# Patient Record
Sex: Female | Born: 1975 | Race: Black or African American | Hispanic: No | Marital: Married | Smoking: Never smoker
Health system: Southern US, Community
[De-identification: ages and names within clinical notes are randomized; demographics above are authoritative.]

## PROBLEM LIST (undated history)

## (undated) DIAGNOSIS — Z86718 Personal history of other venous thrombosis and embolism: Secondary | ICD-10-CM

## (undated) DIAGNOSIS — E0500 Thyrotoxicosis with diffuse goiter without thyrotoxic crisis or storm: Secondary | ICD-10-CM

## (undated) HISTORY — DX: Thyrotoxicosis with diffuse goiter without thyrotoxic crisis or storm: E05.00

---

## 2004-06-01 ENCOUNTER — Encounter (HOSPITAL_COMMUNITY): Admission: RE | Admit: 2004-06-01 | Discharge: 2004-08-30 | Payer: Self-pay | Admitting: Endocrinology

## 2005-01-03 DIAGNOSIS — Z86718 Personal history of other venous thrombosis and embolism: Secondary | ICD-10-CM

## 2005-01-03 HISTORY — DX: Personal history of other venous thrombosis and embolism: Z86.718

## 2005-04-15 ENCOUNTER — Encounter: Payer: Self-pay | Admitting: Vascular Surgery

## 2005-04-15 ENCOUNTER — Ambulatory Visit (HOSPITAL_COMMUNITY): Admission: RE | Admit: 2005-04-15 | Discharge: 2005-04-15 | Payer: Self-pay | Admitting: Internal Medicine

## 2006-11-27 ENCOUNTER — Ambulatory Visit: Admission: RE | Admit: 2006-11-27 | Discharge: 2006-11-27 | Payer: Self-pay | Admitting: Obstetrics and Gynecology

## 2006-11-27 ENCOUNTER — Inpatient Hospital Stay (HOSPITAL_COMMUNITY): Admission: AD | Admit: 2006-11-27 | Discharge: 2006-11-27 | Payer: Self-pay | Admitting: Obstetrics and Gynecology

## 2006-11-27 ENCOUNTER — Ambulatory Visit: Payer: Self-pay | Admitting: *Deleted

## 2006-11-27 ENCOUNTER — Encounter (HOSPITAL_COMMUNITY): Payer: Self-pay | Admitting: Obstetrics and Gynecology

## 2007-04-11 ENCOUNTER — Inpatient Hospital Stay (HOSPITAL_COMMUNITY): Admission: AD | Admit: 2007-04-11 | Discharge: 2007-04-11 | Payer: Self-pay | Admitting: Obstetrics and Gynecology

## 2007-04-12 ENCOUNTER — Inpatient Hospital Stay (HOSPITAL_COMMUNITY): Admission: RE | Admit: 2007-04-12 | Discharge: 2007-04-15 | Payer: Self-pay | Admitting: Obstetrics and Gynecology

## 2007-06-30 ENCOUNTER — Emergency Department (HOSPITAL_COMMUNITY): Admission: EM | Admit: 2007-06-30 | Discharge: 2007-07-01 | Payer: Self-pay | Admitting: Emergency Medicine

## 2008-01-09 ENCOUNTER — Ambulatory Visit (HOSPITAL_COMMUNITY): Admission: RE | Admit: 2008-01-09 | Discharge: 2008-01-09 | Payer: Self-pay | Admitting: Obstetrics and Gynecology

## 2008-01-18 ENCOUNTER — Ambulatory Visit (HOSPITAL_COMMUNITY): Admission: RE | Admit: 2008-01-18 | Discharge: 2008-01-18 | Payer: Self-pay | Admitting: Obstetrics and Gynecology

## 2008-02-15 ENCOUNTER — Ambulatory Visit (HOSPITAL_COMMUNITY): Admission: RE | Admit: 2008-02-15 | Discharge: 2008-02-15 | Payer: Self-pay | Admitting: Obstetrics and Gynecology

## 2008-03-01 ENCOUNTER — Inpatient Hospital Stay (HOSPITAL_COMMUNITY): Admission: AD | Admit: 2008-03-01 | Discharge: 2008-03-01 | Payer: Self-pay | Admitting: Obstetrics and Gynecology

## 2008-03-14 ENCOUNTER — Ambulatory Visit (HOSPITAL_COMMUNITY): Admission: RE | Admit: 2008-03-14 | Discharge: 2008-03-14 | Payer: Self-pay | Admitting: Internal Medicine

## 2008-06-21 ENCOUNTER — Inpatient Hospital Stay (HOSPITAL_COMMUNITY): Admission: AD | Admit: 2008-06-21 | Discharge: 2008-06-24 | Payer: Self-pay | Admitting: Obstetrics and Gynecology

## 2009-01-03 HISTORY — PX: OTHER SURGICAL HISTORY: SHX169

## 2009-04-23 IMAGING — US US OB FOLLOW-UP
1 series · 18 of 28 positions shown · non-contrast
Comparison: none

OBSTETRICAL ULTRASOUND:
 This ultrasound was performed in The [HOSPITAL], and the [facility] report will be stored to [REDACTED] PACS.

[Series 1: us ob follow-up · 18 of 55 slices shown]
[im 1/55]
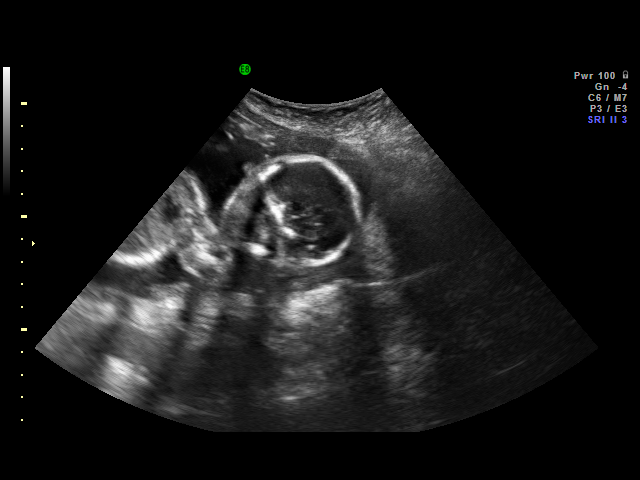
[im 5/55]
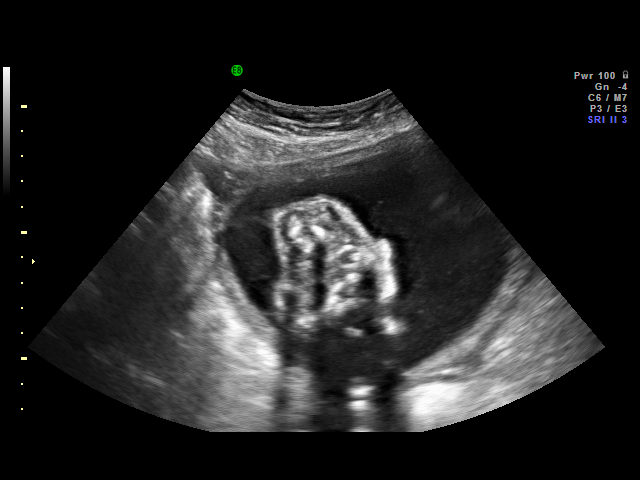
[im 7/55]
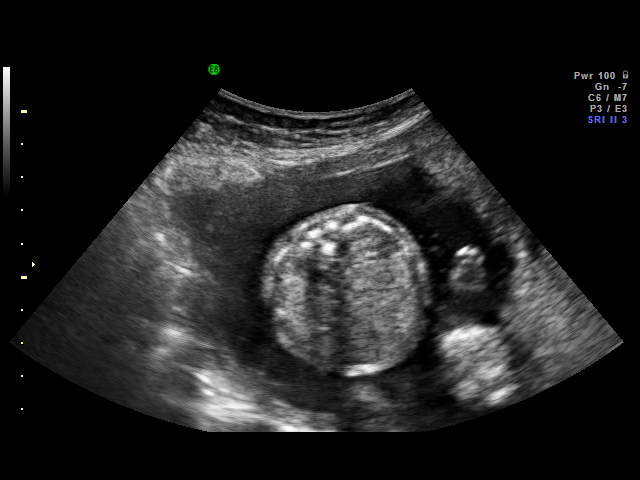
[im 11/55]
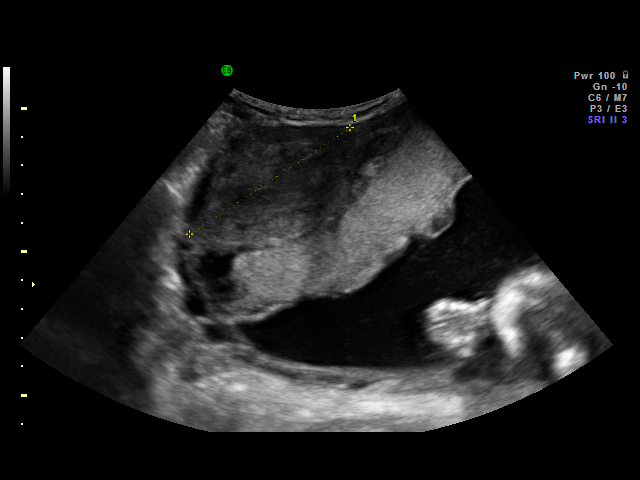
[im 15/55]
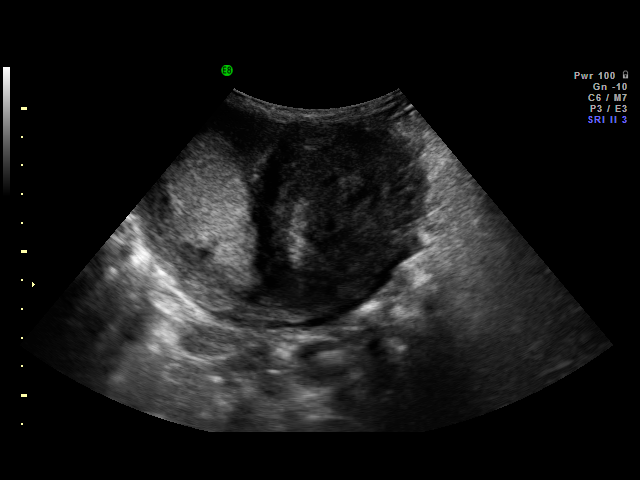
[im 17/55]
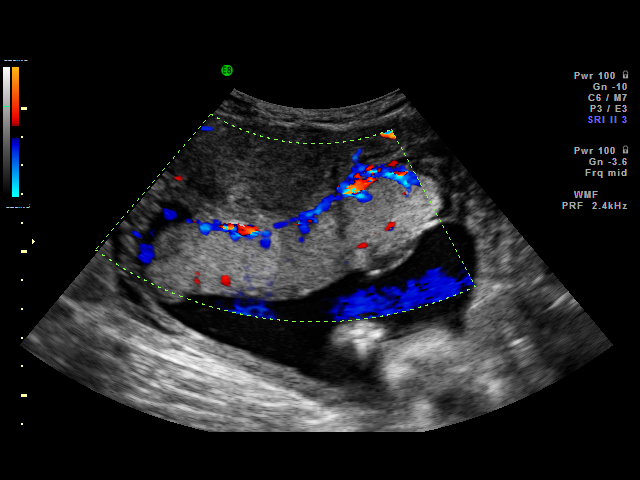
[im 21/55]
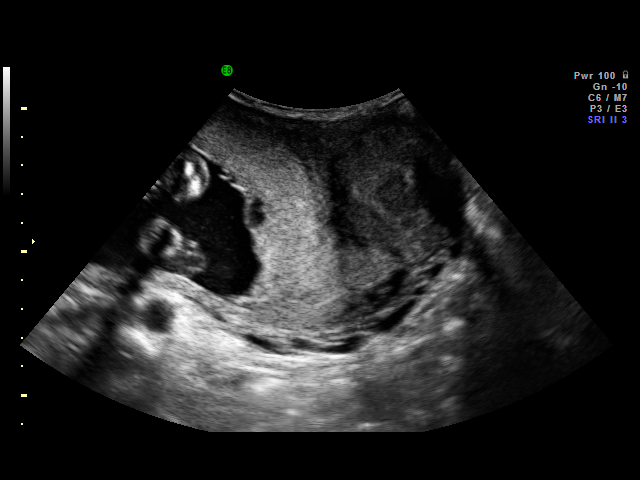
[im 23/55]
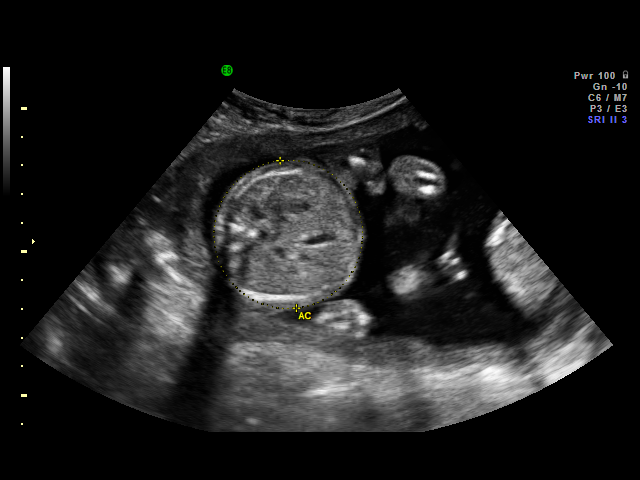
[im 27/55]
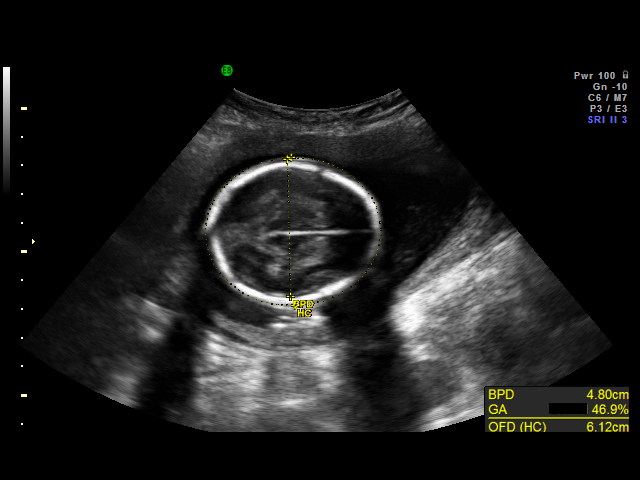
[im 29/55]
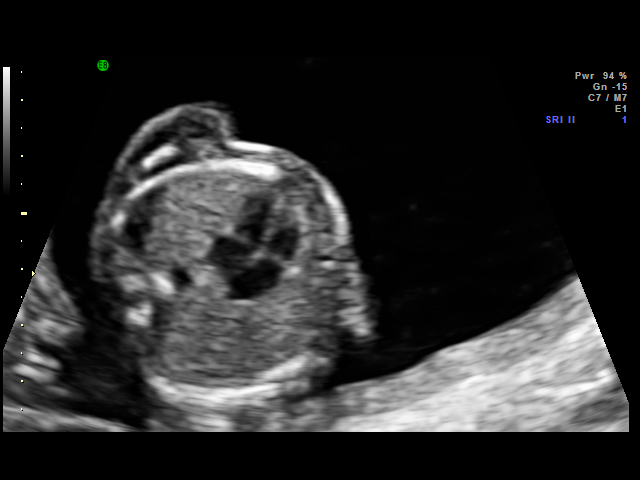
[im 33/55]
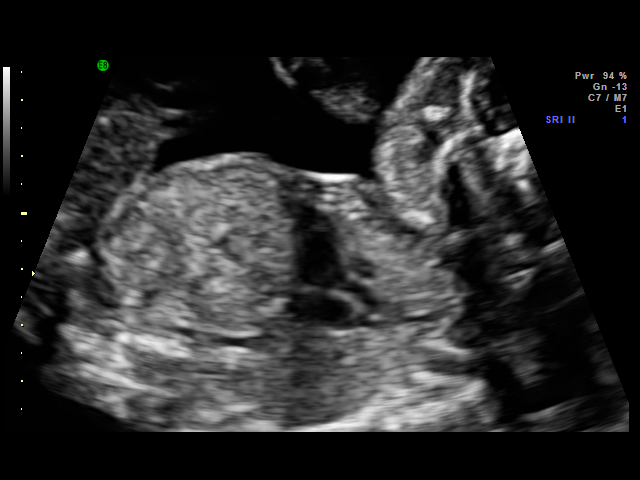
[im 35/55]
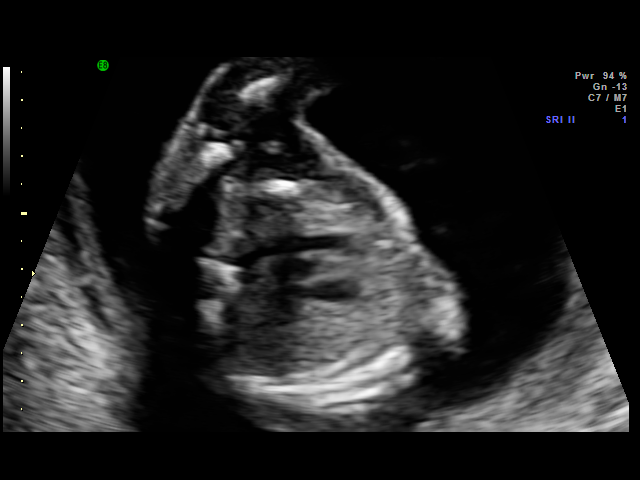
[im 39/55]
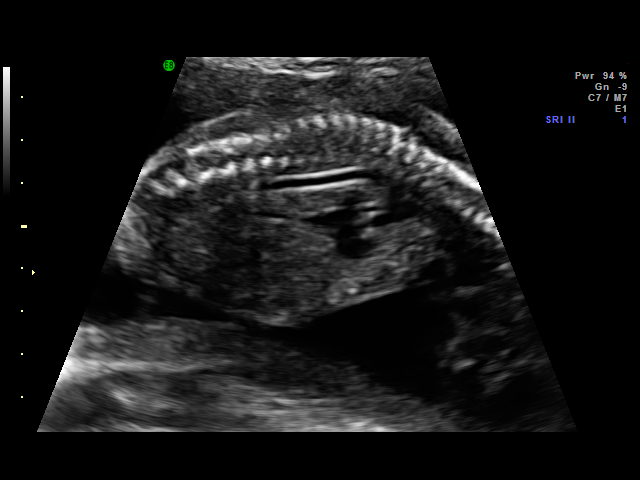
[im 43/55]
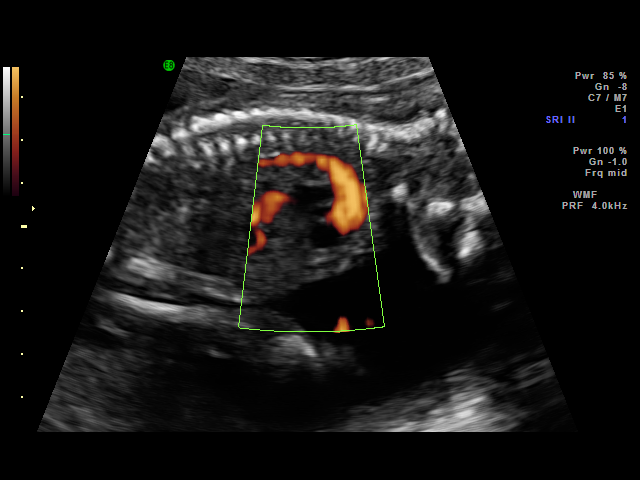
[im 45/55]
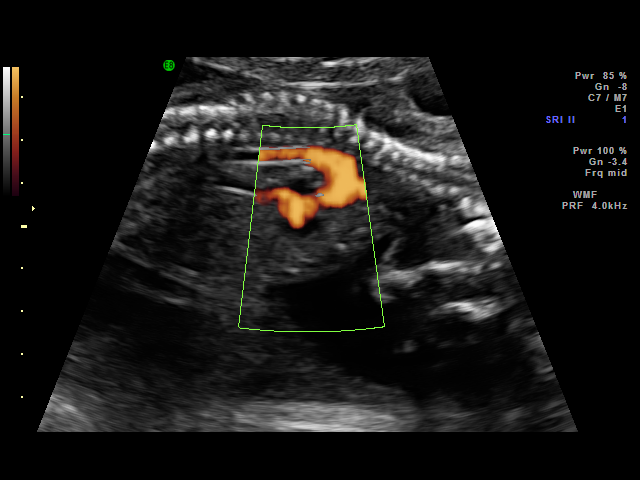
[im 49/55]
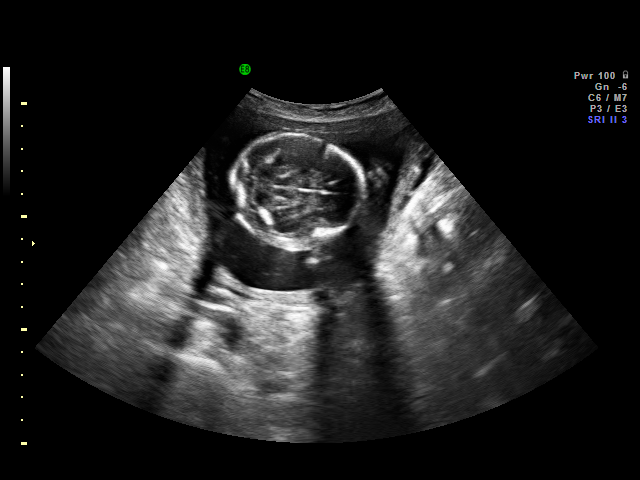
[im 51/55]
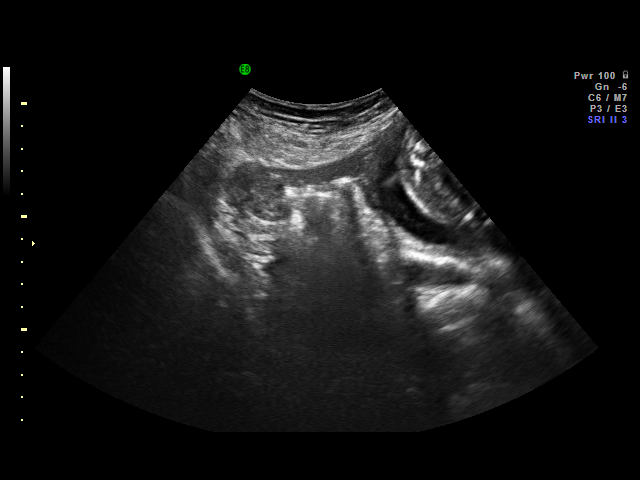
[im 55/55]
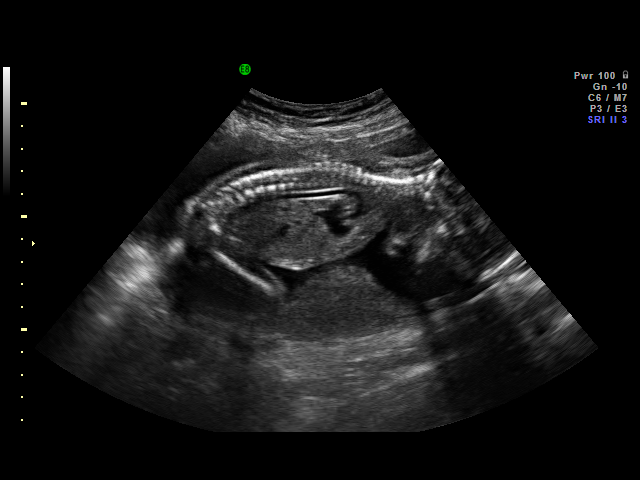

[18 of 28 positions shown; findings below may reference images not displayed]

IMPRESSION: [facility] has also been faxed to the ordering physician.

## 2010-01-24 ENCOUNTER — Encounter: Payer: Self-pay | Admitting: Internal Medicine

## 2010-01-24 ENCOUNTER — Encounter: Payer: Self-pay | Admitting: Endocrinology

## 2010-04-12 LAB — CBC
HCT: 28.1 % — ABNORMAL LOW (ref 36.0–46.0)
HCT: 29 % — ABNORMAL LOW (ref 36.0–46.0)
HCT: 37.8 % (ref 36.0–46.0)
MCHC: 33.7 g/dL (ref 30.0–36.0)
MCV: 72.9 fL — ABNORMAL LOW (ref 78.0–100.0)
Platelets: 149 10*3/uL — ABNORMAL LOW (ref 150–400)
RBC: 3.85 MIL/uL — ABNORMAL LOW (ref 3.87–5.11)
RBC: 5.15 MIL/uL — ABNORMAL HIGH (ref 3.87–5.11)
RDW: 14.6 % (ref 11.5–15.5)
RDW: 14.8 % (ref 11.5–15.5)
WBC: 10 10*3/uL (ref 4.0–10.5)
WBC: 8.7 10*3/uL (ref 4.0–10.5)

## 2010-04-20 LAB — WET PREP, GENITAL
Clue Cells Wet Prep HPF POC: NONE SEEN
Trich, Wet Prep: NONE SEEN
Yeast Wet Prep HPF POC: NONE SEEN

## 2010-05-18 NOTE — Discharge Summary (Signed)
NAMECHARLEAN, Sherri Martinez                 ACCOUNT NO.:  [id number]   MEDICAL RECORD NO.:  [id number]           PATIENT TYPE:   LOCATION:                                 FACILITY:   PHYSICIAN:  Dineen Kid. Rana Snare, M.D.    DATE OF BIRTH:  [birth year]   DATE OF ADMISSION:  06/21/2008  DATE OF DISCHARGE:  06/23/2008                               DISCHARGE SUMMARY   ADMITTING DIAGNOSES:  1. Intrauterine pregnancy at 38-1/2 weeks estimated gestational age.  2. Previous cesarean section.  3. Spontaneous onset of labor.   DISCHARGE DIAGNOSES:  1. Status post low transverse cesarean section.  2. A viable female infant.   PROCEDURE:  Repeat low transverse cesarean section.   REASON FOR ADMISSION:  Please see written H and P.   HOSPITAL COURSE:  The patient was a 35 year old gravida 5, para 1 that  was admitted to [facility] at 38-1/2 weeks estimated  gestational age with spontaneous onset of labor.  The patient had had a  previous cesarean delivery and desired repeat.  On admission, vital  signs were stable, fetal heart tones in the 130s with good  accelerations.  Cervix was examined and found to be 1 cm dilated, 80%  effaced, vertex at a -2 station.  The patient was prepped for cesarean  delivery and taken to the operating room where spinal anesthesia was  administered without difficulty.  A low-transverse incision was made  with delivery of a viable female infant weighing 7 pounds 4 ounces with  Apgars of 9 at one minute and 9 at five minutes.  The patient tolerated  the procedure well and taken to the recovery room in stable condition.  On postoperative day #1, the patient was without complaint.  Vital signs  were stable.  She was afebrile.  Abdomen soft with good return of bowel  function.  Her fundus was firm and nontender.  Abdominal dressing noted  to be clean, dry, and intact.  Laboratory findings showed hemoglobin of  9.7, platelet count 138,000, and WBC count 10.0.  On  postoperative day  #2, the patient desired early discharge.  Vital signs were stable.  She  was afebrile.  Fundus firm and nontender.  Incision was clean, dry, and  intact.  The staples were intact.  Discharge instructions were reviewed,  and the patient was later discharged home.   CONDITION ON DISCHARGE:  Stable.   DIET:  Regular, as tolerated.   ACTIVITY:  No heavy lifting, no driving x2 weeks, no vaginal entry.   FOLLOWUP:  The patient is to follow up in the office in 2-3 days for  staple removal.  She is to call for temperature greater than 100  degrees, persistent nausea, vomiting, heavy vaginal bleeding, and/or  redness or drainage from the incisional site.   DISCHARGE MEDICATIONS:  1. Percocet 5/325, #30 one p.o. every 4-6 h. p.r.n.  2. Motrin 600 mg every 6 hours.  3. Synthroid 112 mcg daily.  4. Prenatal vitamins 1 p.o. daily.      Julio Sicks, N.P.  Dineen Kid Rana Snare, M.D.  Electronically Signed    CC/MEDQ  D:  06/23/2008  T:  06/24/2008  Job:  [id number]

## 2010-05-18 NOTE — Op Note (Signed)
Sherri Martinez, GUEVARA NO.:  [id number]   MEDICAL RECORD NO.:  [id number]          PATIENT TYPE:  MAT   LOCATION:  MATC                          FACILITY:  [facility]   PHYSICIAN:  Zelphia Cairo, MD    DATE OF BIRTH:  [birth year]   DATE OF PROCEDURE:  04/12/2007  DATE OF DISCHARGE:                               OPERATIVE REPORT   PREOPERATIVE DIAGNOSIS:  1. Term intrauterine pregnancy.  2. Breech presentation.   POSTOPERATIVE DIAGNOSIS:  1. Term intrauterine pregnancy.  2. Breech presentation.  3. Fibroid uterus.   PROCEDURE:  Primary low transverse cesarean delivery.   SURGEON:  Zelphia Cairo, M.D.   ANESTHESIA:  Spinal.   FINDINGS:  Vigorous female infant in the frank breech presentation with  Apgars of 9 and 9, two vessel cord, fibroid uterus.   SPECIMEN:  Placenta for disposal.   URINE OUTPUT:  575 mL of clear urine.   ESTIMATED BLOOD LOSS:  700 mL.   COMPLICATIONS:  None.   CONDITION:  To PACU in stable condition.   DESCRIPTION OF PROCEDURE:  The patient was taken to the operating room  where she was placed in the supine position where spinal anesthesia was  found to be adequate.  She was placed in the supine position with a left  tilt.  She was prepped and draped in a sterile fashion.  A Foley  catheter was inserted.  A Pfannenstiel skin incision was made with a  scalpel and carried down to the underlying fascia.  The fascia was  incised in the midline and extended laterally using Mayo scissors.  Kocher clamps were used to grasp the superior portion of the fascia.  This was tented upwards and the underlying rectus muscles were dissected  off using the Bovie.  The inferior portion of the fascia was tented  upwards and the underlying rectus muscles were dissected off using  curved Mayo scissors.  The peritoneum was then identified, tented  upwards with hemostats, and entered sharply with Metzenbaum scissors.  This was extended superiorly and  inferiorly with good visualization of  the bladder.  The bladder blade was then inserted.  The vesicouterine  peritoneum was then incised with Metzenbaum scissors.  This was extended  laterally and dissected off the lower uterine segment bluntly.  The  bladder blade was replaced.   The uterine incision was then made with a scalpel and this was extended  bluntly using my fingers.  The fetal buttocks were then brought to the  incision and the fetus was delivered using standard breech maneuvers.  The mouth and nose were bulb suctioned as the cord was clamped and cut  and the infant was taken to the awaiting pediatric staff. The placenta  was then manually removed from the uterus and the uterus was  exteriorized from the pelvis.  There was noted to be a large  approximately 7 cm fibroid in the left fundus and a smaller 3 cm fibroid  in the anterior uterus. A clean lap sponge was used to clear the inside  of the uterus of all clots and  debris.  The uterine incision was closed  in a double layer closure using 0 chromic.  The uterus was placed back  into the pelvic cavity and the pelvis was irrigated with warm normal  saline.  The uterine incision was reinspected and found to be  hemostatic.  The peritoneum was closed with Monocryl.  The fascia was  closed with a looped 0 PDS and the skin was closed with staples.  The  patient tolerated the procedure well.  Sponge, lap, needle, and  instrument counts were correct x2.      Zelphia Cairo, MD  Electronically Signed     GA/MEDQ  D:  04/12/2007  T:  04/12/2007  Job:  [id number]

## 2010-05-21 NOTE — Op Note (Signed)
[name], Sherri Martinez                 ACCOUNT NO.:  [id number]   MEDICAL RECORD NO.:  [id number]          PATIENT TYPE:  INP   LOCATION:  9137                          [facility]ILITY[facility]   PHYSICIAN:  Zelphia Cairo, MD    DATE OF BIRTH:  [birth year]   DATE OF PROCEDURE:  DATE OF DISCHARGE:  06/24/2008                               OPERATIVE REPORT   PREOPERATIVE DIAGNOSES:  1. Intrauterine pregnancy, at term.  2. Prior cesarean section, desires repeat.  3. Labor.   PROCEDURE:  Repeat low-transverse cesarean delivery.   SURGEON:  Zelphia Cairo, MD   ANESTHESIA:  Spinal.   FINDINGS:  Vigorous female infant with Apgars of 9 and 9, weight 7 pounds  4 ounces.   SPECIMENS:  Placenta for disposal.   URINE OUTPUT:  Clear.   COMPLICATIONS:  None.   CONDITION:  Stable to recovery room.   PROCEDURE:  Sherri Martinez was taken to the operating room where spinal  anesthesia was found to be adequate.  She was placed in the supine  position with the left tilt.  She was prepped and draped in sterile  fashion, and a Foley catheter was inserted sterilely.  A Pfannenstiel  skin incision was made with a scalpel and carried down to the underlying  fascia.  The fascia was incised in the midline and extended laterally.  Kocher clamps were used to grasp the superior and inferior portion of  the fascia.  The fascia was tented upwards and the underlying rectus  muscles were dissected off using the Bovie.  Peritoneum was then  identified and entered sharply.  This was extended superiorly and  inferiorly with good visualization of the bladder.  The bladder flap was  then created and the bladder blade was inserted.   Uterine incision was made with a scalpel and extended bluntly using my  fingers.  Fetal vertex was brought to the uterine incision and the fetus  was delivered using fundal pressure.  Mouth and nose were suctioned as  the infant's cord was clamped and cut and the infant was taken to the  awaiting  pediatric staff.  The placenta was manually removed from the  uterus and the uterus was cleared off all clots and debris using a dry  lap sponge.  Uterus was exteriorized from the pelvis.  The uterine  incision was closed using 0 chromic in a running locked fashion using a  double layer closure.  The uterus was then placed back into the pelvis.  The pelvis was copiously irrigated with  warm normal saline.  The uterine incision was reinspected and found to  be hemostatic.  The peritoneum was then reapproximated with 0 Monocryl.  The fascia was closed with a loop 0 PDS and the skin was closed with  staples.  Sponge, lap, needle, and instrument counts were correct x2.      Zelphia Cairo, MD  Electronically Signed     GA/MEDQ  D:  07/09/2008  T:  07/10/2008  Job:  [id number]

## 2010-05-21 NOTE — Discharge Summary (Signed)
Sherri Martinez, Sherri Martinez                 ACCOUNT NO.:  [id number]   MEDICAL RECORD NO.:  [id number]          PATIENT TYPE:  INP   LOCATION:  9122                          [facility]ACILITY:  [facility]   PHYSICIAN:  Juluis Mire, M.D.   DATE OF BIRTH:  [birth year]   DATE OF ADMISSION:  04/12/2007  DATE OF DISCHARGE:  04/15/2007                               DISCHARGE SUMMARY   ADMITTING DIAGNOSES:  1. Intrauterine pregnancy at term.  2. Breech presentation.  3. Uterine fibroids.   DISCHARGE DIAGNOSES:  1. Status post low-transverse cesarean section.  2. A viable female infant.   PROCEDURE:  Primary low-transverse cesarean section.   REASON FOR ADMISSION:  Please see written H&P.   HOSPITAL COURSE:  The patient was a 35 year old gravida 4, para 0-0-2-0  that was admitted to [facility] at 52 weeks' estimated  gestational age for scheduled cesarean section.  The patient had known  breech presentation.  She was now admitted for cesarean delivery.  On  the morning of admission, the patient was taken to the operating room  where spinal anesthesia was administered without difficulty.  A low-  transverse incision was made with delivery of a viable female infant  weighing 6 pounds 15 ounces with Apgars of 9 at 1 and 9 at 5 minutes.  The patient tolerated the procedure well and taken to the recovery room  in stable condition.  On postoperative day 1, the patient was without  complaints.  Vital signs were stable.  She was afebrile.  Abdomen, soft  with good return of bowel function.  Fundus is firm and nontender.  Abdominal dressing was noted to be clean, dry, and intact.  Laboratory  findings showed hemoglobin of 9.9.  On postoperative day 2, the patient  was without complaint.  Vital signs remained stable.  She was afebrile.  Fundus firm and nontender.  Incision was noted to have a slight amount  of oozing.  Incision was intact.  No evidence of a hematoma.  The  patient was voiding well.  Later  that day, the patient did complain of  some pain in her left calf, which seemed to be on the left lateral  portion of the calf.  Negative Homans sign.  Pedal pulses were  bilaterally the same.  The patient was given heat and rest.  On the  following morning, the patient continued to have some left leg pain.  Vital signs were stable.  She was febrile.  Fundus was firm and  nontender.  Incision was cleaned, she was voiding well.  There was no  evidence of cords or erythema.  She had some lateral tenderness noted to  the left of the calf.  Incision was low for DVT and Homans sign was  negative.  Discharge instructions were reviewed and the patient was  later discharged home.   CONDITION ON DISCHARGE:  Stable.   DIET:  Regular as tolerated.   ACTIVITY:  No heavy lifting, no driving x2 weeks, and no vaginal entry.   FOLLOWUP:  The patient is to follow up in the office  in 1 week.  She is  to call for temperature greater than 100 degrees, persistent nausea,  vomiting, heavy vaginal bleeding and/or redness or drainage from the  incisional site.  The patient was also instructed to call for increasing  leg pain, edema, redness, or swelling.   DISCHARGE MEDICATIONS:  1. Tylox #30, one p.o. every 4-6 hours.  2. Motrin 600 mg every 6 hours.  3. Prenatal vitamins one p.o. daily.      Julio Sicks, N.P.      Juluis Mire, M.D.  Electronically Signed    CC/MEDQ  D:  05/03/2007  T:  05/04/2007  Job:  [id number]

## 2010-09-28 LAB — ABO/RH: ABO/RH(D): A POS

## 2010-09-28 LAB — CBC
Hemoglobin: 12.3
Hemoglobin: 9.9 — ABNORMAL LOW
MCV: 71.8 — ABNORMAL LOW
MCV: 72.8 — ABNORMAL LOW
WBC: 9.7

## 2010-09-28 LAB — RPR: RPR Ser Ql: NONREACTIVE

## 2010-09-28 LAB — TYPE AND SCREEN: ABO/RH(D): A POS

## 2010-09-30 LAB — URINALYSIS, ROUTINE W REFLEX MICROSCOPIC
Hgb urine dipstick: NEGATIVE
pH: 6

## 2010-09-30 LAB — URINE MICROSCOPIC-ADD ON

## 2010-09-30 LAB — URINE CULTURE

## 2010-10-12 LAB — URINALYSIS, ROUTINE W REFLEX MICROSCOPIC
Bilirubin Urine: NEGATIVE
Glucose, UA: NEGATIVE
Hgb urine dipstick: NEGATIVE
Specific Gravity, Urine: 1.02
Urobilinogen, UA: 0.2
pH: 7

## 2010-11-10 ENCOUNTER — Other Ambulatory Visit (HOSPITAL_COMMUNITY): Payer: Self-pay | Admitting: Internal Medicine

## 2010-11-10 DIAGNOSIS — R202 Paresthesia of skin: Secondary | ICD-10-CM

## 2010-11-10 DIAGNOSIS — R42 Dizziness and giddiness: Secondary | ICD-10-CM

## 2010-11-14 ENCOUNTER — Ambulatory Visit (HOSPITAL_COMMUNITY)
Admission: RE | Admit: 2010-11-14 | Discharge: 2010-11-14 | Disposition: A | Discharge status: Home / Self Care | Payer: 59 | Source: Ambulatory Visit | Attending: Internal Medicine | Admitting: Internal Medicine

## 2010-11-14 ENCOUNTER — Ambulatory Visit (HOSPITAL_COMMUNITY)
Admission: RE | Admit: 2010-11-14 | Discharge: 2010-11-14 | Disposition: A | Discharge status: Home / Self Care | Payer: Commercial Managed Care - PPO | Source: Ambulatory Visit | Attending: Internal Medicine | Admitting: Internal Medicine

## 2010-11-14 DIAGNOSIS — G939 Disorder of brain, unspecified: Secondary | ICD-10-CM | POA: Insufficient documentation

## 2010-11-14 DIAGNOSIS — R42 Dizziness and giddiness: Secondary | ICD-10-CM

## 2010-11-14 DIAGNOSIS — R202 Paresthesia of skin: Secondary | ICD-10-CM

## 2010-11-14 DIAGNOSIS — R209 Unspecified disturbances of skin sensation: Secondary | ICD-10-CM | POA: Insufficient documentation

## 2010-12-02 ENCOUNTER — Encounter: Payer: Self-pay | Admitting: Neurology

## 2011-01-12 ENCOUNTER — Ambulatory Visit (INDEPENDENT_AMBULATORY_CARE_PROVIDER_SITE_OTHER): Payer: Commercial Managed Care - PPO | Admitting: Neurology

## 2011-01-12 ENCOUNTER — Encounter: Payer: Self-pay | Admitting: Neurology

## 2011-01-12 VITALS — BP 122/74 | HR 76 | Ht 64.0 in | Wt 133.0 lb

## 2011-01-12 DIAGNOSIS — R9389 Abnormal findings on diagnostic imaging of other specified body structures: Secondary | ICD-10-CM

## 2011-01-12 NOTE — Progress Notes (Signed)
Dear Dr. Mikal Plane,  Thank you for having me see Sherri Martinez in consultation today at [facility] for her problem with the possible diagnosis of MS.  As you may recall, she is a 36 y.o. 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  Her problems began in May 2012 when she had the sudden onset of the "ebb and flow" sensation lasting about 2 minutes while driving a car and turning her head.  This recurred several minutes later and then went away.  The next day she began having constant left eye pressure as well as headaches that lasted several weeks.  This again was accompanied by paroxysms of the dizziness with head turning.  This remitted after a vacation as well as doing vestibular exercises.  The problem recurred in August when she had the sudden onset of a left sided headache, with dizziness and left facial numbness.  This again came and went, but the dizziness was more constant.  This prompted an MRI of her brain that revealed multiple white matter lesions and resulted in concern for the diagnosis of MS.  It also showed a possible lesion in the left optic nerve.  Over the last several months she has continued to have headaches with light and sound sensitivity 1-2 times per week.  She always takes an OTC medication to abort these and so they typically last only 30-45 minutes.  She has had a diagnosis of MS considered in the past.  She was seen at [facility] in 2004-2005 for headache and at that time an MRI revealed two white matter lesions -- she had a subsequent LP that apparently was unrevealing and it was felt that she had migraine headaches.  She has not had a follow up MRI since that time except for the one done last year.  She does not complain of loss of vision, double vision, pain with eye  movement, dysarthria, sensory changes or weakness.  She does get tingling in her bilateral hands in the a.m. on occasion.  Past Medical History  Diagnosis Date  . Grave's disease   - no history of severe headaches.  Past Surgical History  Procedure Date  . Cesarean section     x 2    Social History:  She works as a PT at [employer].  She drinks alcohol occasionally.  She does not smoke.  FamilyHistory:  A history of thyroid disease.  No history of headaches, but she is an only child.  No current outpatient prescriptions on file prior to visit.    Allergies  Allergen Reactions  . Biaxin   . Latex   . Other     cyclens      ROS:  13 systems were reviewed and are notable for anxiety making her symptoms of eye pressure and facial numbness worse.  No difficulty with fatigue in heat, no shooting pain down spine.  All other review of systems are unremarkable.   Examination:  Filed Vitals:   01/12/11 0820  BP: 122/74  Pulse: 76  Height: 5\' 4"  (1.626 m)  Weight: 133 lb (60.328 kg)     In general, well appearing women.  H&N: injected left eye.  Cardiovascular: The patient has a regular rate and rhythm and no carotid bruits.  Fundoscopy:  Disks are  flat. Vessel caliber within normal limits.  No significant pallor.  Mental status:   The patient is oriented to person, place and time. Recent and remote memory are intact. Attention span and concentration are normal. Language including repetition, naming, following commands are intact. Fund of knowledge of current and historical events, as well as vocabulary are normal.  Cranial Nerves: Pupils - ?left APD Visual fields full to confrontation. Extraocular movements are intact without nystagmus. Facial sensation and muscles of mastication are intact. Muscles of facial expression are symmetric. Hearing intact to bilateral finger rub. Tongue protrusion, uvula, palate midline.  Shoulder shrug intact  Motor:  The  patient has normal bulk and tone, no pronator drift.  There are no adventitious movements.  5/5 bilaterally.  Reflexes:   Biceps  Triceps Brachioradialis Knee Ankle  Right 2+  2+  2+   2+ 2+  Left  2+  2+  2+   3+ 2+  Toes down  Coordination:  Normal finger to nose.  No dysdiadokinesia.  Sensation is intact to temperature and vibration.  Gait and Station are normal.  Tandem gait is intact.  Romberg is negative  Vestibular:  head thrust normal, normal Fukuda.   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  C spine MRI was unremarkable.  I was not struck by the reported hyperintensity of the left optic nerve.  Impression/Recs:  Sherri Martinez has a very interesting story, but I am not convinced she has MS.  The complaints seem like they could be migrainous in nature and the paroxysmal nature of her sensory changes and vertigo make it less likely this is due to demyelination.  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  Certainly the corpus lesion on her MRI is suspicious.  I am going to proceed with getting the old MRI images from [facility] to compare to the new MRI.  I would also like to get VEP, and BAEP done at [facility].  I am not going to proceed with an LP at this time, but rather would just follow her clinically.  We will also get the old records of her LP results from [facility].   We will see the patient back in 6 weeks after her BAEPs and VEPs.  Thank you for having [place] see Sherri Martinez in consultation.  Feel free to contact me with any questions.  Lupita Raider Modesto Charon, MD [facility], [place] [address] [place], [place] [address] Phone: [phone] Fax: [phone].

## 2011-01-17 ENCOUNTER — Ambulatory Visit: Payer: 59 | Attending: Neurology | Admitting: Audiology

## 2011-01-17 DIAGNOSIS — R94120 Abnormal auditory function study: Secondary | ICD-10-CM | POA: Insufficient documentation

## 2011-01-24 ENCOUNTER — Telehealth: Payer: Self-pay

## 2011-01-24 NOTE — Telephone Encounter (Signed)
Pt called and wanted to let you know her dizziness has been worse over the past 1 1/2 weeks.  She had her BAER done and the results should be coming soon.  Still waiting to hear from [facility] on the VEP.  I will give them another call.

## 2011-01-26 ENCOUNTER — Telehealth: Payer: Self-pay

## 2011-01-26 MED ORDER — AMITRIPTYLINE HCL 25 MG PO TABS: ORAL_TABLET | ORAL | Status: DC

## 2011-01-26 NOTE — Telephone Encounter (Signed)
Spoke to Sherri Martinez.  Told her I compared her [facility] scan to our MRI and the lesions had not changed.  She is still getting dizziness(ebb and flow sensation) with a feeling of pressure in her head.  I am going to start her on Elavil12.5->25.  She is going to send [place] the BAERs.  We will follow up on her VEPs.

## 2011-01-26 NOTE — Telephone Encounter (Signed)
Notified pt of appt for VEP at [facility] on 03/03/11 at 9:30am.

## 2011-01-27 ENCOUNTER — Other Ambulatory Visit: Payer: Self-pay

## 2011-02-05 NOTE — Progress Notes (Addendum)
Received notes from [facility] ED visit.  Not that helpful, no LP data provided.  These notes came via the PCP.  Appears from ED visit the main complaint was headache, blurry vision.  Did not send for scanning.  -------------------------  Compared MRI brain current with [facility] done 6 years ago.  Taking into account different techniques, basically no change in WMD lesions.  ----------------------  BAEPs were normal.  Central sensory processing abnormal but have no idea what the significance of this is(and did not order this test.)  Awaiting VEPs to be done at [facility].

## 2011-02-16 ENCOUNTER — Encounter: Payer: Self-pay | Admitting: Neurology

## 2011-02-28 ENCOUNTER — Telehealth: Payer: Self-pay | Admitting: Neurology

## 2011-02-28 NOTE — Telephone Encounter (Signed)
Called and spoke with the patient. Info given re: VEP. The patient also wants to know if she needs to continue the Amitriptyline every night (she thinks it may be helping some with the dizziness) or if she can just take it as needed? **Dr. Modesto Charon please advise.

## 2011-02-28 NOTE — Telephone Encounter (Signed)
I think that with her normal BAERs and MRI that we can forgo the VEPs for now.  Jan - could you let her know.

## 2011-02-28 NOTE — Telephone Encounter (Signed)
Pt is supposed to go for EZP on Thursday. Verified benefits, and pt is required to pay half of entire bill. Is the test necessary or are the MRI and BAER comparisons enough? Call pt back at work number.

## 2011-02-28 NOTE — Telephone Encounter (Signed)
usually we get the patient to take it every night.  however, she can always try to stop it and if the dizziness gets worse she can restart it.

## 2011-03-01 NOTE — Telephone Encounter (Signed)
Patient returned my call. Information given as per Dr. Modesto Charon below. The patient states she will continue the med for another month or so and stop if sx are better. No other questions or concerns at this time.

## 2011-03-01 NOTE — Telephone Encounter (Signed)
Left a message on the patient's mobile number to return my call. 

## 2013-07-11 ENCOUNTER — Ambulatory Visit (INDEPENDENT_AMBULATORY_CARE_PROVIDER_SITE_OTHER): Payer: 59 | Admitting: Sports Medicine

## 2013-07-11 ENCOUNTER — Encounter: Payer: Self-pay | Admitting: Sports Medicine

## 2013-07-11 VITALS — BP 123/83 | HR 69 | Ht 65.0 in | Wt 128.0 lb

## 2013-07-11 DIAGNOSIS — M67979 Unspecified disorder of synovium and tendon, unspecified ankle and foot: Secondary | ICD-10-CM | POA: Insufficient documentation

## 2013-07-11 DIAGNOSIS — M719 Bursopathy, unspecified: Secondary | ICD-10-CM

## 2013-07-11 DIAGNOSIS — S99919A Unspecified injury of unspecified ankle, initial encounter: Secondary | ICD-10-CM

## 2013-07-11 DIAGNOSIS — S8990XA Unspecified injury of unspecified lower leg, initial encounter: Secondary | ICD-10-CM

## 2013-07-11 DIAGNOSIS — S99929A Unspecified injury of unspecified foot, initial encounter: Secondary | ICD-10-CM

## 2013-07-11 DIAGNOSIS — M67971 Unspecified disorder of synovium and tendon, right ankle and foot: Secondary | ICD-10-CM

## 2013-07-11 DIAGNOSIS — M6790 Unspecified disorder of synovium and tendon, unspecified site: Secondary | ICD-10-CM

## 2013-07-11 MED ORDER — NITROGLYCERIN 0.2 MG/HR TD PT24: MEDICATED_PATCH | TRANSDERMAL | Status: DC

## 2013-07-11 NOTE — Progress Notes (Signed)
  Sherri Martinez - 38 y.o. female MRN [id number]  Date of birth: 03/06/1975  SUBJECTIVE:  Including CC & ROS.  Right heel pain: Patient reports approximate 10 day insidious onset of right posterior heel pain.  She reports running a 10K followed by a slow 5K approximately 2 weeks ago.  3 days later she began experiencing this discomfort.  It is nonradiating and is worsened with activity.  Does not occur at night.  She's had no prior injury to this area and has been doing multiple modalities including ice, stretching, dry needling and has been taking occasional ibuprofen.  She is an avid runner and has completed multiple half marathons this year she has an upcoming full marathon.  Stable he runs 15-20 miles per week 3-4 days per week.    HISTORY: Past Medical, Surgical, Social, and Family History Reviewed & Updated per EMR. Pertinent Historical Findings include: History of thyroid disorder.  2 prior C-sections otherwise no surgeries.  Nonsmoker. She is a physical therapist  DATA REVIEWED: None  PHYSICAL EXAM:  VS: BP:123/83 mmHg  HR:69bpm  TEMP: ( )  RESP:   HT:5\' 5"  (165.1 cm)   WT:128 lb (58.06 kg)  BMI:21.3 PHYSICAL EXAM: GENERAL:  Adult athletic African American  female. In no discomfort; no respiratory distress  PSYCH:  alert and appropriate, good insight  Right ankle Exam:        Gen/Palp:  No deformity, no swelling no ecchymosis.  There is tenderness palpation over the most intense junction of the soleus and Achilles.  There is no tenderness over the insertion of the Achilles. ROM:  Full ankle and knee range of motion NV:  The bilateral Lower Extremity myotomes are 5+/5.  Sensation is grossly intact to light sensation in bilateral Lower Extremity dermatomes. Tests:  Strength is 5+/5 and ankle dorsiflexion, plantar flexion, inversion and eversion.  She is slight pain with plantar flexion and dorsiflexion at the muscular tendinous junction. MSK Ultrasound Exam - Limited Of Right  Achilles           Findings:   There is a small halo sign along the midportion of the Achilles tendon.  There are hypoechoic changes within the musculotendinous junction of the soleus and the Achilles.  There does not appear to be any significant tearing or disruption of the tissue.  Minimally increased vascularity     Impression:   Acute strain of the soleus and muscular tendinous junction of the Achilles head  ASSESSMENT & PLAN: See problem based charting & AVS for pt instructions.

## 2013-07-11 NOTE — Assessment & Plan Note (Addendum)
Acute condition  - right leg. there's evidence of injury to the musculotendinous junction of the soleus and the Achilles tendon.  It does not appear to be any significant tearing 1. Nitroglycerin protocol 2. Eccentric strengthening with bent knee to isolate soleus 3. Compression sleeve fitted today 4. Heel left provided for right shoe 5. Okay to continue running but decreased frequency and duration increasing over the next 4 weeks as tolerated > Patient will follow 4 weeks for reevaluation and repeat ultrasound

## 2013-07-11 NOTE — Patient Instructions (Signed)
Eccentric Exercises for your achilles Wear the compression sleeve and heel lift when running and when active at work  Nitroglycerin Protocol   Apply 1/4 nitroglycerin patch to affected area daily.  Change position of patch within the affected area every 24 hours.  You may experience a headache during the first 1-2 weeks of using the patch, these should subside.  If you experience headaches after beginning nitroglycerin patch treatment, you may take your preferred over the counter pain reliever.  Another side effect of the nitroglycerin patch is skin irritation or rash related to patch adhesive.  Please notify our office if you develop more severe headaches or rash, and stop the patch.  Tendon healing with nitroglycerin patch may require 12 to 24 weeks depending on the extent of injury.  Men should not use if taking Viagra, Cialis, or Levitra.   Do not use if you have migraines or rosacea.

## 2013-07-16 ENCOUNTER — Ambulatory Visit: Payer: Commercial Managed Care - PPO | Admitting: Sports Medicine

## 2013-08-08 ENCOUNTER — Other Ambulatory Visit: Payer: Self-pay | Admitting: *Deleted

## 2013-08-08 ENCOUNTER — Ambulatory Visit (INDEPENDENT_AMBULATORY_CARE_PROVIDER_SITE_OTHER): Payer: 59 | Admitting: Physician Assistant

## 2013-08-08 DIAGNOSIS — R079 Chest pain, unspecified: Secondary | ICD-10-CM

## 2013-08-08 NOTE — Progress Notes (Signed)
Requested by Dr Gerrit HeckHousein's for chest pain

## 2013-08-08 NOTE — Progress Notes (Signed)
Exercise Treadmill Test  Sherri Martinez J Artis is a 38 y.o. female non-smoker with hx of hypothyroidism referred by PCP for ETT due to hx of palpitations and chest pain. She is training for [employer] in November.  Exam unremarkable. ECG normal.  Pre-Exercise Testing Evaluation Rhythm: normal sinus  Rate: 78 bpm     Test  Exercise Tolerance Test Ordering MD: Melene Mullerhristopher McAlhaney, MD  Interpreting MD: Tereso NewcomerScott Rik Wadel, PA-C  Unique Test No: 1  Treadmill:  1  Indication for ETT: chest pain - rule out ischemia  Contraindication to ETT: No   Stress Modality: exercise - treadmill  Cardiac Imaging Performed: non   Protocol: standard Bruce - maximal  Max BP:  177/92  Max MPHR (bpm):  184 85% MPR (bpm):  156  MPHR obtained (bpm):  160 % MPHR obtained:  86  Reached 85% MPHR (min:sec):  13:05 Total Exercise Time (min-sec):  13:23  Workload in METS:  15.9 Borg Scale: 16  Reason ETT Terminated:  patient's desire to stop    ST Segment Analysis At Rest: normal ST segments - no evidence of significant ST depression With Exercise: no evidence of significant ST depression  Other Information Arrhythmia:  No Angina during ETT:  absent (0) Quality of ETT:  diagnostic  ETT Interpretation:  normal - no evidence of ischemia by ST analysis  Comments: Excellent exercise capacity. No chest pain. Normal BP response to exercise. No ST changes to suggest ischemia.  Rare PACs noted in recovery (patient was symptomatic).  Recommendations: F/u with PCP as directed. Signed,  Tereso NewcomerScott Tansy Lorek, PA-C   08/08/2013 9:55 AM

## 2013-08-14 ENCOUNTER — Ambulatory Visit: Payer: 59 | Admitting: Sports Medicine

## 2013-09-09 ENCOUNTER — Emergency Department (HOSPITAL_COMMUNITY)
Admission: EM | Admit: 2013-09-09 | Discharge: 2013-09-09 | Disposition: A | Discharge status: Home / Self Care | Payer: 59 | Source: Home / Self Care

## 2013-09-09 ENCOUNTER — Encounter (HOSPITAL_COMMUNITY): Payer: Self-pay | Admitting: Emergency Medicine

## 2013-09-09 DIAGNOSIS — S91109A Unspecified open wound of unspecified toe(s) without damage to nail, initial encounter: Secondary | ICD-10-CM

## 2013-09-09 DIAGNOSIS — S91209A Unspecified open wound of unspecified toe(s) with damage to nail, initial encounter: Secondary | ICD-10-CM

## 2013-09-09 DIAGNOSIS — X58XXXA Exposure to other specified factors, initial encounter: Secondary | ICD-10-CM

## 2013-09-09 HISTORY — DX: Personal history of other venous thrombosis and embolism: Z86.718

## 2013-09-09 NOTE — ED Notes (Signed)
Right great toenail pain, reports sitting behind a van seat while it was being adjusted.  Seat slid back into great toe nail, ripping nail almost off.

## 2013-09-09 NOTE — ED Provider Notes (Signed)
Medical screening examination/treatment/procedure(s) were performed by non-physician practitioner and as supervising physician I was immediately available for consultation/collaboration.  Leslee Home, M.D.  Reuben Likes, MD 09/09/13 [phone]

## 2013-09-09 NOTE — ED Provider Notes (Signed)
CSN: [id number]     Arrival date & time 09/09/13  1648 History   First MD Initiated Contact with Patient 09/09/13 1726     Chief Complaint  Patient presents with  . Toe Pain   (Consider location/radiation/quality/duration/timing/severity/associated sxs/prior Treatment) HPI Comments: Mechanism of Right great toe nail injury as above. Pt describes partial avulsion.   Past Medical History  Diagnosis Date  . Grave's disease   . History of blood clots    Past Surgical History  Procedure Laterality Date  . Cesarean section      x 2   Family History  Problem Relation Age of Onset  . Hypertension Mother   . Hypertension Father   . Thyroid disease Mother    History  Substance Use Topics  . Smoking status: Never Smoker   . Smokeless tobacco: Never Used  . Alcohol Use: Yes     Comment: occasionally   OB History   Grav Para Term Preterm Abortions TAB SAB Ect Mult Living                 Review of Systems  All other systems reviewed and are negative.   Allergies  Clarithromycin; Latex; and Other  Home Medications   Prior to Admission medications   Medication Sig Start Date End Date Taking? Authorizing Provider  amitriptyline (ELAVIL) 25 MG tablet start with 1/2 tab before bed for 1 week, then increase to full tab. 01/26/11   Milas Gain, MD  aspirin 81 MG tablet Take 160 mg by mouth daily.    Historical Provider, MD  levothyroxine (SYNTHROID, LEVOTHROID) 100 MCG tablet Take 100 mcg by mouth daily.    Historical Provider, MD  nitroGLYCERIN (NITRODUR - DOSED IN MG/24 HR) 0.2 mg/hr patch Place 1/4 of patch over affected region. Remove and replace once daily.  Slightly alter skin placement daily 07/11/13   Andrena Mews, DO   BP 131/91  Pulse 64  Temp(Src) 98 F (36.7 C) (Oral)  Resp 16  SpO2 100% Physical Exam  Nursing note and vitals reviewed. Constitutional: She appears well-developed and well-nourished. No distress.  Skin: Skin is warm and dry.  R great toe nail  intact and covering the nail bed completely . There is evidence of partial nail fracture at the base/cuticle. Light tugging does not lift the nail off the bed. No bleeding, swelling or redness. No joint tenderness. Sensory and motor intact. Nl color and warmth.     ED Course  Procedures (including critical care time) Labs Review Labs Reviewed - No data to display  Imaging Review No results found.   MDM   1. Traumatic avulsion of nail plate of toe, initial encounter    Recommend leaving nail intact as it is. Let new nail grow in. Watch for infection and return as needed.  Warm salt water soaks for 2 days Keep bandaid on for protection and nail plate stability.    Hayden Rasmussen, NP 09/09/13 [phone]

## 2013-09-09 NOTE — Discharge Instructions (Signed)
Warm salt water soaks for a couple of days. Clean around nail with alcohol Watch for infection Bandaid to keep nail attached.   Nail Avulsion Injury Nail avulsion means that you have lost the whole, or part of a nail. The nail will usually grow back in 2 to 6 months. If your injury damaged the growth center of the nail, the nail may be deformed, split, or not stuck to the nail bed. Sometimes the avulsed nail is stitched back in place. This provides temporary protection to the nail bed until the new nail grows in.  HOME CARE INSTRUCTIONS   Raise (elevate) your injury as much as possible.  Protect the injury and cover it with bandages (dressings) or splints as instructed.  Change dressings as instructed. SEEK MEDICAL CARE IF:   There is increasing pain, redness, or swelling.  You cannot move your fingers or toes. Document Released: 01/28/2004 Document Revised: 03/14/2011 Document Reviewed: 11/21/2008 [facility] Patient Information 2015 [employer] [place], [place]. This information is not intended to replace advice given to you by your health care provider. Make sure you discuss any questions you have with your health care provider.

## 2013-09-10 ENCOUNTER — Ambulatory Visit: Payer: 59 | Admitting: Sports Medicine

## 2013-10-03 ENCOUNTER — Encounter: Payer: Self-pay | Admitting: Sports Medicine

## 2013-10-03 ENCOUNTER — Ambulatory Visit (INDEPENDENT_AMBULATORY_CARE_PROVIDER_SITE_OTHER): Payer: 59 | Admitting: Sports Medicine

## 2013-10-03 VITALS — BP 112/82 | HR 75 | Ht 65.0 in | Wt 128.0 lb

## 2013-10-03 DIAGNOSIS — S76311A Strain of muscle, fascia and tendon of the posterior muscle group at thigh level, right thigh, initial encounter: Secondary | ICD-10-CM

## 2013-10-03 NOTE — Patient Instructions (Signed)
It was great to see you.  Wear the compression sleeve with activity for the next 3-6 months to help decrease the swelling and speed up the healing.  Askling Exercises

## 2013-10-04 DIAGNOSIS — S76319A Strain of muscle, fascia and tendon of the posterior muscle group at thigh level, unspecified thigh, initial encounter: Secondary | ICD-10-CM | POA: Insufficient documentation

## 2013-10-04 NOTE — Progress Notes (Addendum)
  Sherri Martinez - 38 y.o. female MRN [id number]  Date of birth: 02/28/1975  SUBJECTIVE:  Including CC & ROS.  The patient presents as an established patient for evaluation of a new problem: Left posterior distal thigh pain patient reports one-week history of pain that occurred during a flat three-mile run. Insidious onset during the run to the point that she had to stop and walk. Activities of daily living have been bothersome including lunging, squatting and working with patient is a physical therapist. Not taking any specific medications.  She reports her right Achilles tendon has completely resolved and she had been doing well until this time progressing to running 3-4 days per week up to 20 miles per week. She is anticipating running marathon and late November.  HISTORY: Past Medical, Surgical, Social, and Family History Reviewed & Updated per EMR. Pertinent Historical Findings include: Nonsmoker Works as a Adult nursephysical therapist Thyroid disorder otherwise healthy   OBJECTIVE FINDINGS:  VS:  HT:5\' 5"  (165.1 cm)   WT:128 lb (58.06 kg)  BMI:21.3          BP:112/82 mmHg  HR:75bpm  TEMP: ( )  RESP:   PHYSICAL EXAM:            GENERAL:  Adult athletic African American female. In no discomfort; no respiratory distress                PSYCH:  alert and appropriate, good insight  Right leg Exam:   APPEAR/PALP:  Normal-appearing, there is a small palpable nodularity over the distal biceps tendon and tenderness palpation over the insertion of the biceps tendon. She has no anterior knee tenderness but posterior lateral joint line pain. No significant knee effusion. No pain at ischial tuberosity                    ROM:  Left knee lacking 30 of extension compared to the right when supine        STRENGTH:  Hamstring strength 5 out of 5 but pain with resisted flexion at 30 of knee bend                   NV:  Sensation grossly intact, warm well perfused, good capillary refill Limited MSK Ultrasound Exam  Of right leg:         Findings:   Hypoechoic change within the biceps femoris tendon at the area of maximal tenderness, fibers intact with no retraction. There is a hypoechoic change over the insertion of the tendon with fluid tracking proximally approximately 3-4 cm      Impression: The above findings are consistent with distal biceps strain  Ortho Exam   ASSESSMENT: 1. Hamstring strain, right, initial encounter    Right distal biceps strain,  PLAN: See problem based charting & AVS for additional documentation. - Compression - Askling Therapeutic exercises per handout and AVS - discussed return to running/play with improved pain, and negative H-Test > Return if symptoms worsen or fail to improve. Will need gait eval (consider in fatigued state) if not improving or with recurrent injuries

## 2013-10-08 ENCOUNTER — Ambulatory Visit: Payer: 59 | Admitting: Sports Medicine

## 2015-03-17 ENCOUNTER — Other Ambulatory Visit: Payer: Self-pay | Admitting: Nurse Practitioner

## 2015-03-17 DIAGNOSIS — N644 Mastodynia: Secondary | ICD-10-CM

## 2015-03-24 ENCOUNTER — Ambulatory Visit
Admission: RE | Admit: 2015-03-24 | Discharge: 2015-03-24 | Disposition: A | Discharge status: Home / Self Care | Payer: BLUE CROSS/BLUE SHIELD | Source: Ambulatory Visit | Attending: Nurse Practitioner | Admitting: Nurse Practitioner

## 2015-03-24 DIAGNOSIS — N644 Mastodynia: Secondary | ICD-10-CM

## 2017-04-06 ENCOUNTER — Ambulatory Visit
Admission: RE | Admit: 2017-04-06 | Discharge: 2017-04-06 | Disposition: A | Discharge status: Home / Self Care | Payer: BC Managed Care – PPO | Source: Ambulatory Visit | Attending: Internal Medicine | Admitting: Internal Medicine

## 2017-04-06 ENCOUNTER — Encounter (INDEPENDENT_AMBULATORY_CARE_PROVIDER_SITE_OTHER): Payer: Self-pay | Admitting: Orthopedic Surgery

## 2017-04-06 ENCOUNTER — Other Ambulatory Visit: Payer: Self-pay | Admitting: Internal Medicine

## 2017-04-06 ENCOUNTER — Ambulatory Visit (INDEPENDENT_AMBULATORY_CARE_PROVIDER_SITE_OTHER): Payer: BC Managed Care – PPO | Admitting: Orthopedic Surgery

## 2017-04-06 VITALS — Ht 65.0 in | Wt 128.0 lb

## 2017-04-06 DIAGNOSIS — M6701 Short Achilles tendon (acquired), right ankle: Secondary | ICD-10-CM

## 2017-04-06 DIAGNOSIS — M7741 Metatarsalgia, right foot: Secondary | ICD-10-CM

## 2017-04-06 DIAGNOSIS — M79671 Pain in right foot: Secondary | ICD-10-CM

## 2017-04-06 NOTE — Progress Notes (Signed)
Office Visit Note   Patient: Sherri Martinez           Date of Birth: 02/04/1975           MRN: [id number] Visit Date: 04/06/2017              Requested by: Georgann Housekeeper, MD [address] [place], [place] [address] PCP: Georgann Housekeeper, MD  Chief Complaint  Patient presents with  . 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  She is very active with running very active with work.  States she has pain with pushoff for about the past 2-1/2 weeks.  She states that ibuprofen did not help.  Assessment & Plan: Visit Diagnoses:  1. Achilles tendon contracture, right   2. Metatarsalgia, right foot     Plan: Recommended a stiff soled Trail running shoe such as the [place]oka.  Recommended heel cord stretching with her knee extended to stretch the gastrocnemius muscle.  Recommended nonimpact aerobic exercises until this resolves also recommended vitamin D3.  Discussed that if she fails conservative treatment a Weil osteotomy for the second third and fourth metatarsal is a surgical option but feels that this should resolve without surgery.  Follow-Up Instructions: Return if symptoms worsen or fail to improve.   Ortho Exam  Patient is alert, oriented, no adenopathy, well-dressed, normal affect, normal respiratory effort. Examination patient has good pulses she has good ankle good subtalar motion left foot has dorsiflexion about 20 degrees past neutral right foot has dorsiflexion just short of neutral with her knee extended.  Patient is point tender to palpation over the third metatarsal head.  Radiographs do not show any definitive fractures she may have a nutrient foramen where she is tender.  She does have a long second third and fourth metatarsal and with her Achilles tightness she is overloading these metatarsal heads.  Imaging: Dg Foot Complete Right  Result Date:  04/06/2017 CLINICAL DATA:  New onset right foot pain in the third metatarsal region for 2-3 weeks. Runner. EXAM: RIGHT FOOT COMPLETE - 3+ VIEW COMPARISON:  None. FINDINGS: There is a 4 mm region of subtly decreased cortical density in the neck of the third metatarsal best seen on the oblique radiograph with questionable faint periosteal reaction. No discrete fracture is identified. There is no dislocation. Joint space widths are preserved. Soft tissues are unremarkable. IMPRESSION: Slightly decreased cortical density in the third metatarsal neck which may reflect early stress reaction. Electronically Signed   By: Sebastian Ache M.D.   On: 04/06/2017 09:19   No images are attached to the encounter.  Labs: Lab Results  Component Value Date   REPTSTATUS 07/02/2007 FINAL 06/30/2007   CULT INSIGNIFICANT GROWTH 06/30/2007    @LABSALLVALUES (HGBA1)@  Body mass index is 21.3 kg/m.  Orders:  No orders of the defined types were placed in this encounter.  No orders of the defined types were placed in this encounter.    Procedures: No procedures performed  Clinical Data: No additional findings.  ROS:  All other systems negative, except as noted in the HPI. Review of Systems  Objective: Vital Signs: Ht 5\' 5"  (1.651 m)   Wt 128 lb (58.1 kg)   BMI 21.30 kg/m   Specialty Comments:  No specialty comments available.  PMFS History: Patient Active Problem List   Diagnosis Date Noted  .  Hamstring strain 10/04/2013  . Achilles tendon disorder 07/11/2013   Past Medical History:  Diagnosis Date  . Grave's disease   . History of blood clots     Family History  Problem Relation Age of Onset  . Hypertension Mother   . Hypertension Father   . Thyroid disease Mother     Past Surgical History:  Procedure Laterality Date  . CESAREAN SECTION     x 2   Social History   Occupational History  . Not on file  Tobacco Use  . Smoking status: Never Smoker  . Smokeless tobacco: Never Used    Substance and Sexual Activity  . Alcohol use: Yes    Comment: occasionally  . Drug use: Not on file  . Sexual activity: Not on file

## 2017-04-12 ENCOUNTER — Telehealth (INDEPENDENT_AMBULATORY_CARE_PROVIDER_SITE_OTHER): Payer: Self-pay | Admitting: Orthopedic Surgery

## 2017-04-12 NOTE — Telephone Encounter (Signed)
The patient wanted to know: #1 -  if the xray she had showed she could has an early stress fx (or it could potentially fracture), or was it showing a healing stress fracture?  She continues to have swelling and pain with pushoff, despite buying and wearing the shoes you suggested. #2 - What is the next step (MRI, CT, or what)? Please advise.

## 2017-04-12 NOTE — Telephone Encounter (Signed)
X-ray shows a possible stress fracture.  Would need to come back in to the office in several weeks to repeat an x-ray.  If this repeat x-ray is not definitive we would need to set her up for an MRI scan.

## 2017-04-12 NOTE — Telephone Encounter (Signed)
Patient left a message stating that her PCP received the x-ray report from Dr. Lajoyce Cornersuda and had some questions about the report.  CB#[phone].  Thank you.

## 2017-04-14 NOTE — Telephone Encounter (Signed)
IC patient and advised, appt made for followup

## 2017-05-04 ENCOUNTER — Ambulatory Visit (INDEPENDENT_AMBULATORY_CARE_PROVIDER_SITE_OTHER): Payer: Self-pay | Admitting: Orthopedic Surgery

## 2017-05-16 ENCOUNTER — Other Ambulatory Visit: Payer: Self-pay | Admitting: Internal Medicine

## 2017-05-16 ENCOUNTER — Ambulatory Visit
Admission: RE | Admit: 2017-05-16 | Discharge: 2017-05-16 | Disposition: A | Discharge status: Home / Self Care | Payer: BC Managed Care – PPO | Source: Ambulatory Visit | Attending: Internal Medicine | Admitting: Internal Medicine

## 2017-05-16 DIAGNOSIS — R059 Cough, unspecified: Secondary | ICD-10-CM

## 2017-05-16 DIAGNOSIS — R05 Cough: Secondary | ICD-10-CM

## 2017-07-04 DIAGNOSIS — R490 Dysphonia: Secondary | ICD-10-CM | POA: Insufficient documentation

## 2017-07-04 DIAGNOSIS — K219 Gastro-esophageal reflux disease without esophagitis: Secondary | ICD-10-CM | POA: Insufficient documentation

## 2017-07-04 DIAGNOSIS — R09A2 Foreign body sensation, throat: Secondary | ICD-10-CM | POA: Insufficient documentation

## 2017-08-17 ENCOUNTER — Encounter: Payer: Self-pay | Admitting: *Deleted

## 2017-08-18 ENCOUNTER — Ambulatory Visit: Payer: BC Managed Care – PPO | Admitting: Diagnostic Neuroimaging

## 2017-08-18 ENCOUNTER — Encounter

## 2017-08-18 ENCOUNTER — Encounter: Payer: Self-pay | Admitting: Diagnostic Neuroimaging

## 2017-08-18 VITALS — BP 113/75 | HR 66 | Ht 64.0 in | Wt 135.2 lb

## 2017-08-18 DIAGNOSIS — R42 Dizziness and giddiness: Secondary | ICD-10-CM

## 2017-08-18 DIAGNOSIS — R200 Anesthesia of skin: Secondary | ICD-10-CM

## 2017-08-18 NOTE — Progress Notes (Signed)
[facility]  PATIENT: Sherri Martinez DOB: 1975/12/16  REFERRING CLINICIAN: Arther DamesK Husain HISTORY FROM: patient  REASON FOR VISIT: new consult   HISTORICAL  CHIEF COMPLAINT:  Chief Complaint  Patient presents with  . Head tingling/numbness    rm 7, New Pt "head tingling, neck fullness, possible migraines x 3 months"    HISTORY OF PRESENT ILLNESS:   42 year old female here for evaluation of abnormal sensation in the scalp, neck fullness, headaches, pain.  3-4 months ago patient noticed some intermittent tingling prickly sensation on the left side of her scalp.  Sometimes happens in the right side of the scalp.  Symptoms can last 5 to 10 seconds at a time are intermittent.  Symptoms are worse with anxiety.  Patient also has been feeling slightly off balance with exercises.  She also feels a neck fullness in the front of her neck.  Patient also been having some blurred vision, sensitivity to sound, word finding difficulties.  She also feels a thumping sensation behind her right eye at times.  No prior history of headaches or migraines.  Patient has had some other neurologic symptoms including dizziness and vision changes in the past, had MRI of the brain which showed a few nonspecific T2 hyperintensities.  She was evaluated by neurology who felt that these were related to migraine phenomenon and not MS.  MRI was stable from 2006 until 2012.   REVIEW OF SYSTEMS: Full 14 system review of systems performed and negative with exception of: Fatigue numbness memory loss restless legs increased thirst not enough sleep decreased energy trouble swallowing palpitations.  ALLERGIES: Allergies  Allergen Reactions  . Biaxin [Clarithromycin] Nausea And Vomiting  . Latex   . Other     cyclens  . Tetracyclines & Related Other (See Comments)    dizziness  . Doxycycline Rash    HOME MEDICATIONS: Outpatient Medications Prior to Visit  Medication Sig Dispense Refill  . aspirin  81 MG tablet Take 160 mg by mouth daily.    Marland Kitchen. levothyroxine (SYNTHROID, LEVOTHROID) 112 MCG tablet 112 mcg daily.    Marland Kitchen. omeprazole (PRILOSEC) 20 MG capsule Take 20 mg by mouth daily.    Marland Kitchen. amitriptyline (ELAVIL) 25 MG tablet start with 1/2 tab before bed for 1 week, then increase to full tab. 30 tablet 3  . levothyroxine (SYNTHROID, LEVOTHROID) 100 MCG tablet Take 100 mcg by mouth daily.    . nitroGLYCERIN (NITRODUR - DOSED IN MG/24 HR) 0.2 mg/hr patch Place 1/4 of patch over affected region. Remove and replace once daily.  Slightly alter skin placement daily (Patient not taking: Reported on 08/18/2017) 30 patch 0   No facility-administered medications prior to visit.     PAST MEDICAL HISTORY: Past Medical History:  Diagnosis Date  . Grave's disease   . History of blood clots 2007   left arm    PAST SURGICAL HISTORY: Past Surgical History:  Procedure Laterality Date  . CESAREAN SECTION  2009, 2010   x 2  . diastasis repair  2011    FAMILY HISTORY: Family History  Problem Relation Age of Onset  . Hypertension Mother   . Thyroid disease Mother   . Hypertension Father     SOCIAL HISTORY: Social History   Socioeconomic History  . Marital status: Married    Spouse name: Not on file  . Number of children: 4  . Years of education: DPT  . 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 Financial resource strain: Not on file  . Food insecurity:    Worry: Not on file    Inability: Not on file  . Transportation needs:    Medical: Not on file    Non-medical: Not on file  Tobacco Use  . Smoking status: Never Smoker  . Smokeless tobacco: Never Used  Substance and Sexual Activity  . Alcohol use: Yes    Comment: occasionally  . Drug use: Never  . Sexual activity: Not on file  Lifestyle  . Physical activity:    Days per week: Not on file    Minutes per session: Not on file  . Stress: Not on file    Relationships  . Social connections:    Talks on phone: Not on file    Gets together: Not on file    Attends religious service: Not on file    Active member of club or organization: Not on file    Attends meetings of clubs or organizations: Not on file    Relationship status: Not on file  . Intimate partner violence:    Fear of current or ex partner: Not on file    Emotionally abused: Not on file    Physically abused: Not on file    Forced sexual activity: Not on file  Other Topics Concern  . Not on file  Social History Narrative   Lives with family   Caffeine- 2 x week     PHYSICAL EXAM  GENERAL EXAM/CONSTITUTIONAL: Vitals:  Vitals:   08/18/17 0818  BP: 113/75  Pulse: 66  Weight: 135 lb 3.2 oz (61.3 kg)  Height: 5\' 4"  (1.626 m)     Body mass index is 23.21 kg/m. Wt Readings from Last 3 Encounters:  08/18/17 135 lb 3.2 oz (61.3 kg)  04/06/17 128 lb (58.1 kg)  10/03/13 128 lb (58.1 kg)     Patient is in no distress; well developed, nourished and groomed; neck is supple  CARDIOVASCULAR:  Examination of carotid arteries is normal; no carotid bruits  Regular rate and rhythm, no murmurs  Examination of peripheral vascular system by observation and palpation is normal  EYES:  Ophthalmoscopic exam of optic discs and posterior segments is normal; no papilledema or hemorrhages  Visual Acuity Screening   Right eye Left eye Both eyes  Without correction:     With correction: 20/50 20/20   Comments: Contacts    MUSCULOSKELETAL:  Gait, strength, tone, movements noted in Neurologic exam below  NEUROLOGIC: MENTAL STATUS:  No flowsheet data found.  awake, alert, oriented to person, place and time  recent and remote memory intact  normal attention and concentration  language fluent, comprehension intact, naming intact  fund of knowledge appropriate  CRANIAL NERVE:   2nd - no papilledema on fundoscopic exam; RIGHT EYE MORE SENSITIVE TO LIGHT  2nd,  3rd, 4th, 6th - pupils equal and reactive to light, visual fields full to confrontation, extraocular muscles intact, no nystagmus  5th - facial sensation symmetric  7th - facial strength symmetric  8th - hearing intact  9th - palate elevates symmetrically, uvula midline  11th - shoulder shrug symmetric  12th - tongue protrusion midline  MOTOR:   normal bulk and tone, full strength in the BUE, BLE  SENSORY:   normal and symmetric to light touch, pinprick, temperature, vibration; EXCEPT DECR IN LEFT LEG BELOW KNEE  COORDINATION:   finger-nose-finger, fine finger movements normal  REFLEXES:  deep tendon reflexes present and symmetric  GAIT/STATION:   narrow based gait; able to walk tandem; romberg is negative     DIAGNOSTIC DATA (LABS, IMAGING, TESTING) - I reviewed patient records, labs, notes, testing and imaging myself where available.  Lab Results  Component Value Date   WBC 8.1 06/23/2008   HGB 9.4 (L) 06/23/2008   HCT 28.1 (L) 06/23/2008   MCV 72.9 (L) 06/23/2008   PLT 149 (L) 06/23/2008   No results found for: NA, K, CL, CO2, GLUCOSE, BUN, CREATININE, CALCIUM, PROT, ALBUMIN, AST, ALT, ALKPHOS, BILITOT, GFRNONAA, GFRAA No results found for: CHOL, HDL, LDLCALC, LDLDIRECT, TRIG, CHOLHDL No results found for: ZOXW9UHGBA1C No results found for: VITAMINB12 No results found for: TSH   02/13/04 MRI brain [report only] - Three to four areas of increased T2 and FLAIR signal  within the periventricular white matter on the left which are  atypical for the patient's age.These findings are not typical  for multiple sclerosis; however, this diagnosis cannot be  excluded.   11/14/10 MRI brain [I reviewed images myself and agree with interpretation. -VRP]  - few non-specific T2 hyperintensities; possible left optic nerve hyperintensity, but not clear  11/14/10 MRI cervical [I reviewed images myself and agree with interpretation. -VRP]  -  negative    ASSESSMENT AND PLAN  42 y.o. year old female here with constellation of symptoms including tingling scalp sensations, eye pain, word finding difficulties, neck fullness, balance difficulty.  We will proceed with further work-up.  Dx:  1. Dizziness   2. Numbness     PLAN:  - MRI brain, cervical spine (with and without) - rule out CNS autoimmune, inflamm, demyelinating - may consider MRI thoracic spine, VEP, lumbar puncture in future  Orders Placed This Encounter  Procedures  . MR BRAIN W WO CONTRAST  . MR CERVICAL SPINE W WO CONTRAST   Return pending test results.    Suanne MarkerVIKRAM R. Nissim Fleischer, MD 08/18/2017, 8:47 AM Certified in Neurology, Neurophysiology and Neuroimaging  [facility] [address], [address] [place], [place] [address] [phone]

## 2017-08-21 ENCOUNTER — Telehealth: Payer: Self-pay | Admitting: Diagnostic Neuroimaging

## 2017-08-21 NOTE — Telephone Encounter (Signed)
Patient returned my call [facility]

## 2017-08-21 NOTE — Telephone Encounter (Signed)
lvm for pt to call [facility]  [employer] Auth: [id number] (exp. 08/21/17 to 09/19/17)

## 2017-08-22 ENCOUNTER — Ambulatory Visit: Payer: BC Managed Care – PPO

## 2017-08-22 DIAGNOSIS — R42 Dizziness and giddiness: Secondary | ICD-10-CM

## 2017-08-22 DIAGNOSIS — R200 Anesthesia of skin: Secondary | ICD-10-CM | POA: Diagnosis not present

## 2017-08-22 MED ORDER — GADOPENTETATE DIMEGLUMINE 469.01 MG/ML IV SOLN
12.0000 mL | Freq: Once | INTRAVENOUS | Status: AC | PRN
  Administered 2017-08-22: 12 mL via INTRAVENOUS

## 2017-08-25 ENCOUNTER — Telehealth: Payer: Self-pay | Admitting: Diagnostic Neuroimaging

## 2017-08-25 NOTE — Telephone Encounter (Signed)
Pt is asking for a call when the MRI results are available

## 2017-08-25 NOTE — Telephone Encounter (Signed)
Spoke to pt and relayed that her MRI cervical was normal, her mri brain was stable (no change from MRI 11-14-2010).  She verbalized understanding.  She did make mention that she had episode of 15sec of tingling L face, mandible area when under stress, and then one episode of nausea yesterday (very fleeting had to sit down).  Is playing phone tag with Dr. Annalee GentaShoemaker about the throat tightening (pressure) that comes and goes.  (this more with sweets).  I would relay.  She will monitor and keep track of episodes and call us in a month or sooner if needs too.  Asking for MRI copy of results.  Will come by and sign release next week.

## 2017-08-28 ENCOUNTER — Other Ambulatory Visit: Payer: Self-pay | Admitting: Internal Medicine

## 2017-08-28 DIAGNOSIS — M542 Cervicalgia: Secondary | ICD-10-CM

## 2017-08-28 NOTE — Telephone Encounter (Signed)
Pt Brain and Cervical CD @  the front desk for p/u

## 2017-09-01 ENCOUNTER — Ambulatory Visit
Admission: RE | Admit: 2017-09-01 | Discharge: 2017-09-01 | Disposition: A | Discharge status: Home / Self Care | Payer: BC Managed Care – PPO | Source: Ambulatory Visit | Attending: Internal Medicine | Admitting: Internal Medicine

## 2017-09-01 DIAGNOSIS — M542 Cervicalgia: Secondary | ICD-10-CM

## 2017-09-08 ENCOUNTER — Other Ambulatory Visit: Payer: Self-pay | Admitting: Internal Medicine

## 2017-09-08 DIAGNOSIS — M542 Cervicalgia: Secondary | ICD-10-CM

## 2017-09-12 ENCOUNTER — Other Ambulatory Visit: Payer: BC Managed Care – PPO

## 2017-09-14 ENCOUNTER — Ambulatory Visit
Admission: RE | Admit: 2017-09-14 | Discharge: 2017-09-14 | Disposition: A | Discharge status: Home / Self Care | Payer: BC Managed Care – PPO | Source: Ambulatory Visit | Attending: Internal Medicine | Admitting: Internal Medicine

## 2017-09-14 DIAGNOSIS — M542 Cervicalgia: Secondary | ICD-10-CM

## 2017-09-21 NOTE — Telephone Encounter (Signed)
Message    Per Dr. Marjory LiesPenumalli: Pls offer follow up appt with me or NP to discuss headache treatments (possible migraine).  Called pt and she preferred to see Dr.Penumalli.  Made appt 09-27-17 at 1500. sy

## 2017-09-27 ENCOUNTER — Ambulatory Visit: Payer: Self-pay | Admitting: Diagnostic Neuroimaging

## 2017-12-25 ENCOUNTER — Other Ambulatory Visit: Payer: Self-pay | Admitting: Internal Medicine

## 2017-12-25 DIAGNOSIS — R070 Pain in throat: Secondary | ICD-10-CM

## 2018-01-01 ENCOUNTER — Ambulatory Visit
Admission: RE | Admit: 2018-01-01 | Discharge: 2018-01-01 | Disposition: A | Discharge status: Home / Self Care | Payer: BC Managed Care – PPO | Source: Ambulatory Visit | Attending: Internal Medicine | Admitting: Internal Medicine

## 2018-01-01 ENCOUNTER — Other Ambulatory Visit: Payer: BC Managed Care – PPO

## 2018-01-01 DIAGNOSIS — R070 Pain in throat: Secondary | ICD-10-CM

## 2018-01-01 MED ORDER — IOPAMIDOL (ISOVUE-300) INJECTION 61%
75.0000 mL | Freq: Once | INTRAVENOUS | Status: AC | PRN
  Administered 2018-01-01: 75 mL via INTRAVENOUS

## 2018-08-01 DIAGNOSIS — E039 Hypothyroidism, unspecified: Secondary | ICD-10-CM | POA: Insufficient documentation

## 2019-04-01 ENCOUNTER — Ambulatory Visit: Payer: BC Managed Care – PPO

## 2019-08-30 ENCOUNTER — Other Ambulatory Visit: Payer: Self-pay | Admitting: Endocrinology

## 2019-08-30 DIAGNOSIS — E041 Nontoxic single thyroid nodule: Secondary | ICD-10-CM

## 2019-09-13 ENCOUNTER — Ambulatory Visit
Admission: RE | Admit: 2019-09-13 | Discharge: 2019-09-13 | Disposition: A | Discharge status: Home / Self Care | Payer: BC Managed Care – PPO | Source: Ambulatory Visit | Attending: Endocrinology | Admitting: Endocrinology

## 2019-09-13 DIAGNOSIS — E041 Nontoxic single thyroid nodule: Secondary | ICD-10-CM

## 2019-12-26 ENCOUNTER — Encounter: Payer: Self-pay | Admitting: Nurse Practitioner

## 2019-12-27 ENCOUNTER — Telehealth: Payer: Self-pay | Admitting: Unknown Physician Specialty

## 2019-12-27 NOTE — Telephone Encounter (Signed)
Called to Discuss with patient about Covid symptoms and the use of the monoclonal antibody infusion for those with mild to moderate Covid symptoms and at a high risk of hospitalization.     Pt appears to qualify for this infusion due to co-morbid conditions and/or a member of an at-risk group in accordance with the FDA Emergency Use Authorization.    Unable to reach pt   LMOM 

## 2019-12-30 ENCOUNTER — Telehealth: Payer: Self-pay | Admitting: Unknown Physician Specialty

## 2019-12-30 NOTE — Telephone Encounter (Signed)
Called to discuss with Sherri Martinez about Covid symptoms and the use of  monoclonal antibody infusion for those with mild to moderate Covid symptoms and at a high risk of hospitalization.     Pt is qualified for this infusion due to co-morbid conditions and/or a member of an at-risk group, however,is fully vaccinated at this time

## 2020-05-07 ENCOUNTER — Other Ambulatory Visit: Payer: Self-pay | Admitting: Obstetrics and Gynecology

## 2020-05-07 DIAGNOSIS — N6002 Solitary cyst of left breast: Secondary | ICD-10-CM

## 2020-06-11 ENCOUNTER — Ambulatory Visit
Admission: RE | Admit: 2020-06-11 | Discharge: 2020-06-11 | Disposition: A | Discharge status: Home / Self Care | Payer: BC Managed Care – PPO | Source: Ambulatory Visit | Attending: Obstetrics and Gynecology | Admitting: Obstetrics and Gynecology

## 2020-06-11 ENCOUNTER — Other Ambulatory Visit: Payer: Self-pay

## 2020-06-11 DIAGNOSIS — N6002 Solitary cyst of left breast: Secondary | ICD-10-CM

## 2020-11-19 IMAGING — US US THYROID
1 series · 14 of 25 positions shown · non-contrast
Comparison: 09/14/2017

CLINICAL DATA: Graves disease

EXAM:
THYROID ULTRASOUND
TECHNIQUE: Ultrasound examination of the thyroid gland and adjacent soft
tissues was performed.

[Series 1: us thyroid · 0.04mm/px · 14 of 30 slices shown]
[im 1/30]
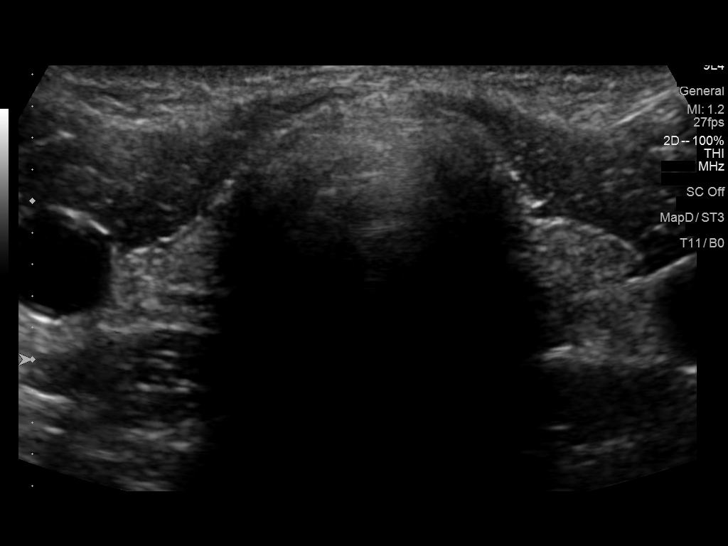
[im 3/30]
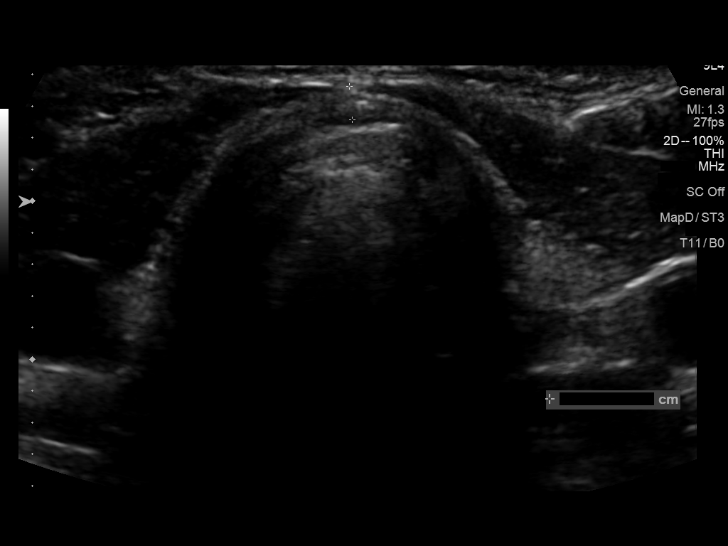
[im 5/30]
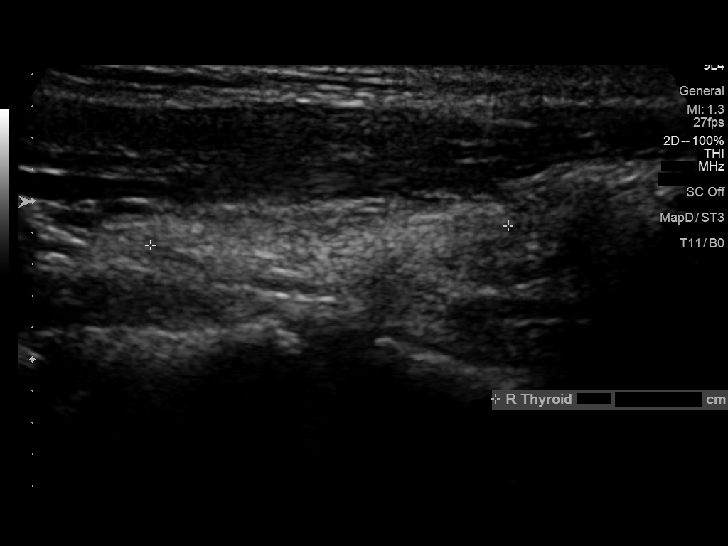
[im 8/30]
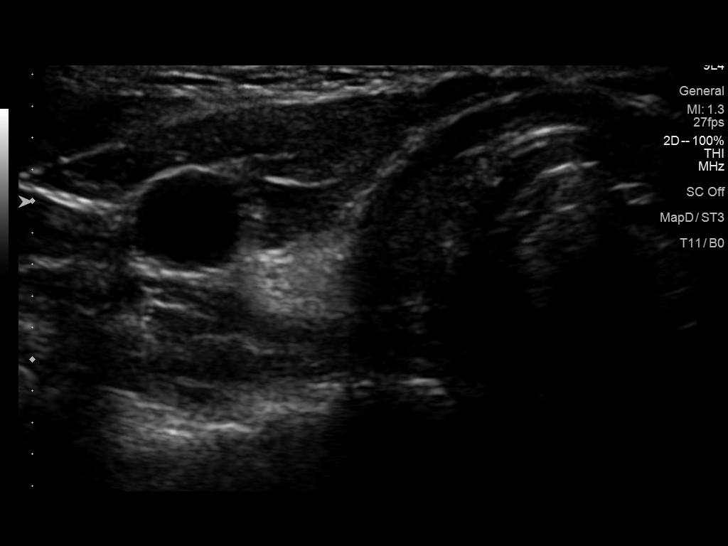
[im 10/30]
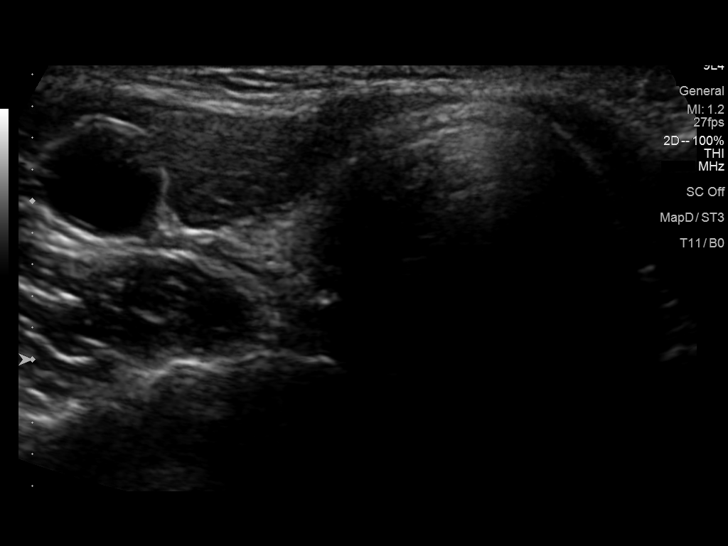
[im 11/30]
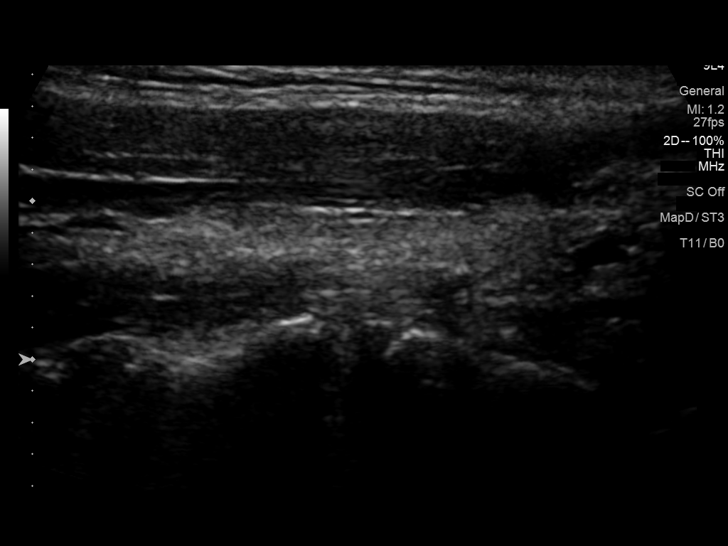
[im 14/30]
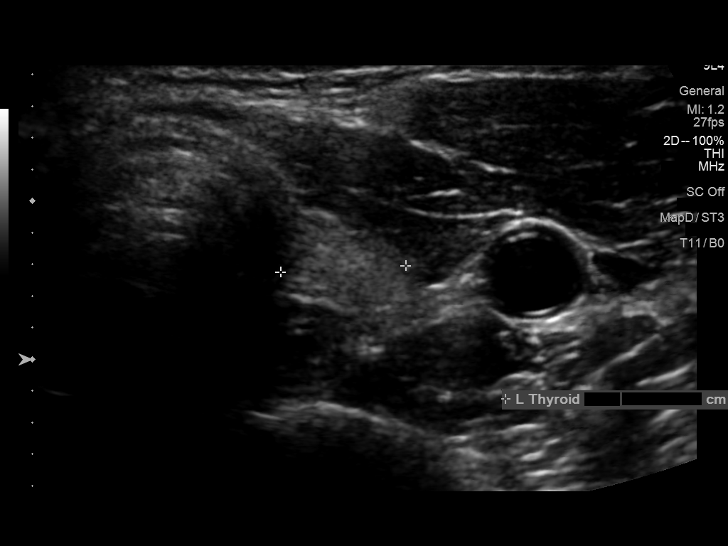
[im 16/30]
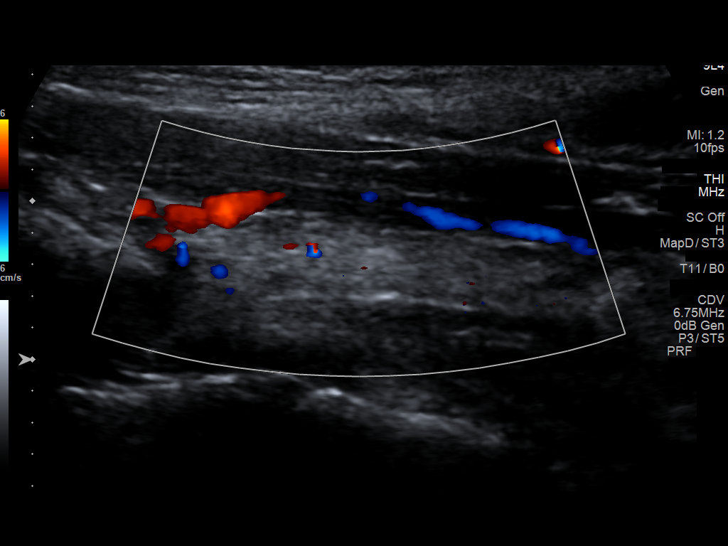
[im 19/30]
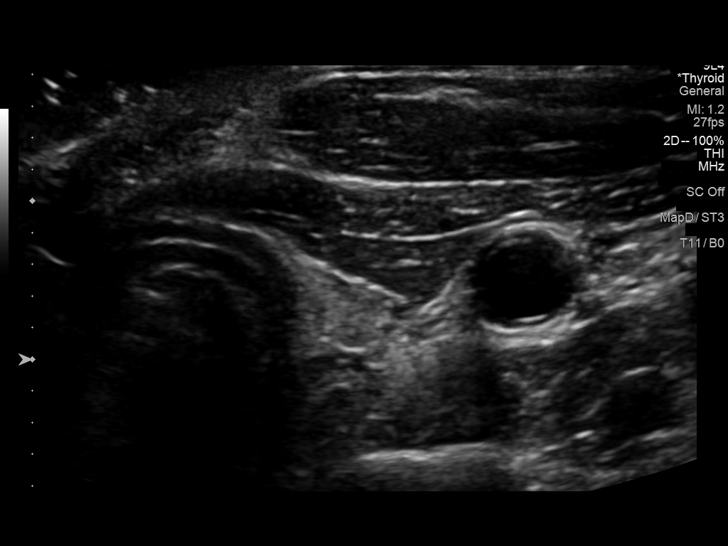
[im 20/30]
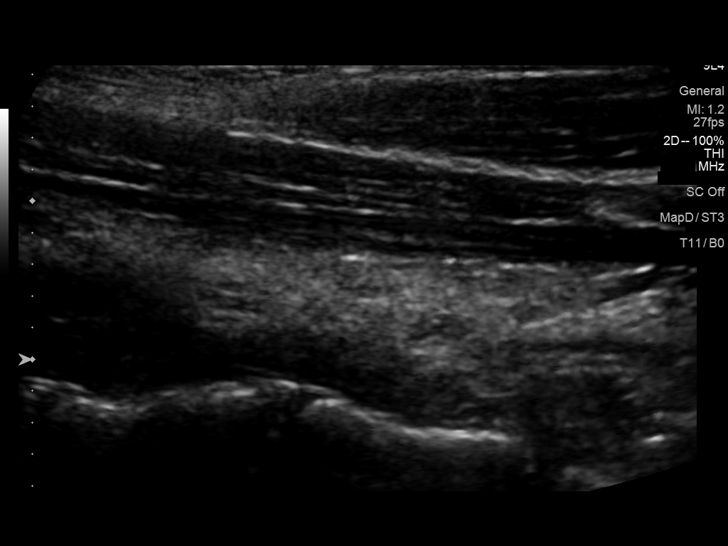
[im 22/30]
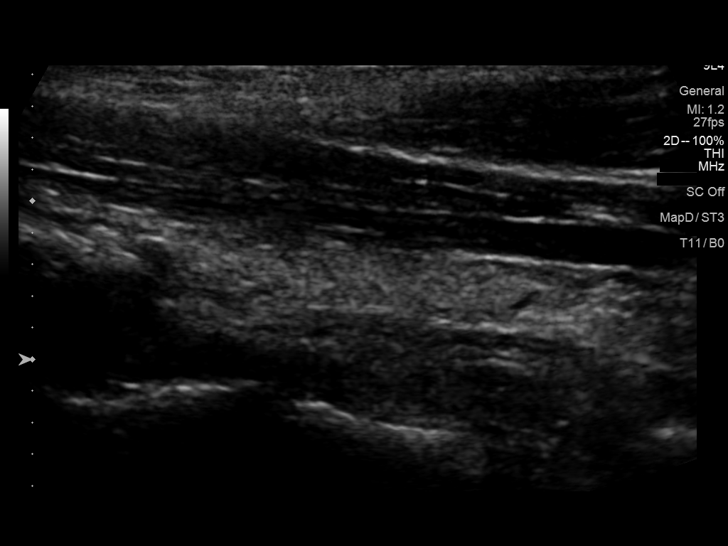
[im 25/30]
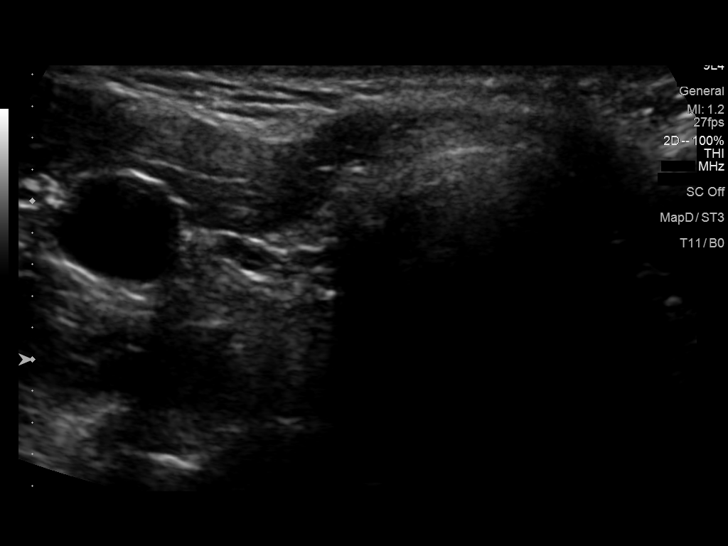
[im 27/30]
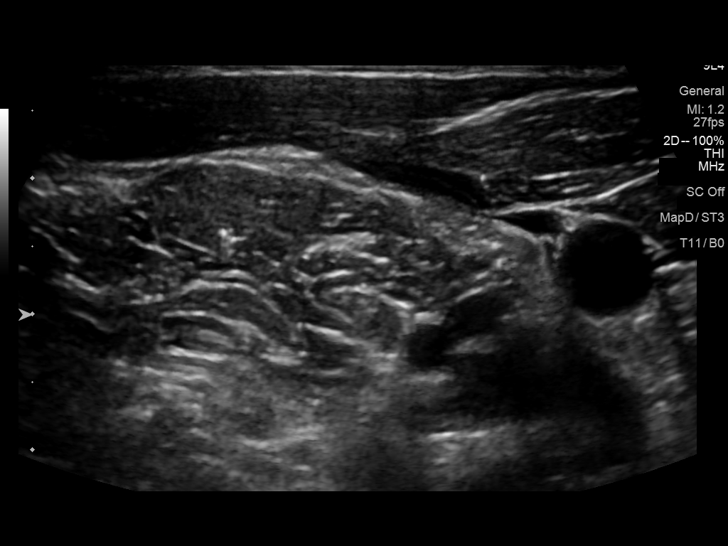
[im 30/30]
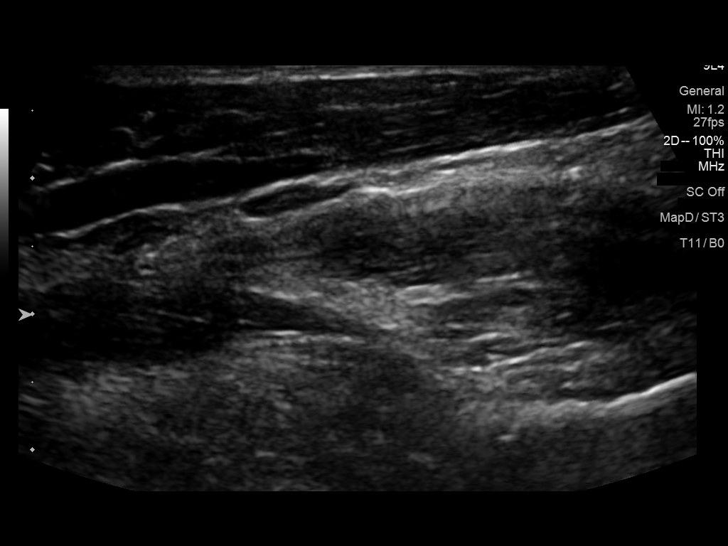

[14 of 25 positions shown; findings below may reference images not displayed]

FINDINGS: Parenchymal Echotexture: Mildly heterogenous

Isthmus: 0.2 cm

Right lobe: 2.3 x 0.5 x 0.5 center

Left lobe: 2.5 x 0.5 x 0.8 cm

_________________________________________________________

Estimated total number of nodules >/= 1 cm: 0

Number of spongiform nodules >/=  2 cm not described below (TR1): 0

Number of mixed cystic and solid nodules >/= 1.5 cm not described
below (TR2): 0

_________________________________________________________

No discrete nodules are seen within the thyroid gland.
IMPRESSION: Small, mildly heterogeneous thyroid gland without evidence for
distinct thyroid nodule.

The above is in keeping with the ACR TI-RADS recommendations - [HOSPITAL] 8383;[DATE].

## 2021-04-22 ENCOUNTER — Other Ambulatory Visit: Payer: Self-pay | Admitting: Obstetrics and Gynecology

## 2021-04-22 DIAGNOSIS — N644 Mastodynia: Secondary | ICD-10-CM

## 2021-05-04 ENCOUNTER — Ambulatory Visit
Admission: RE | Admit: 2021-05-04 | Discharge: 2021-05-04 | Disposition: A | Discharge status: Home / Self Care | Payer: BC Managed Care – PPO | Source: Ambulatory Visit | Attending: Obstetrics and Gynecology | Admitting: Obstetrics and Gynecology

## 2021-05-04 ENCOUNTER — Other Ambulatory Visit: Payer: BC Managed Care – PPO

## 2021-05-04 DIAGNOSIS — N644 Mastodynia: Secondary | ICD-10-CM

## 2022-01-28 ENCOUNTER — Other Ambulatory Visit (HOSPITAL_COMMUNITY): Payer: Self-pay

## 2022-01-28 MED ORDER — LEVOTHYROXINE SODIUM 100 MCG PO TABS
100.0000 ug | ORAL_TABLET | Freq: Every morning | ORAL | 5 refills | Status: AC
  Filled 2022-01-28: qty 26, 30d supply, fill #0

## 2022-05-02 ENCOUNTER — Ambulatory Visit: Payer: Self-pay | Admitting: Surgery

## 2022-05-02 NOTE — H&P (Signed)
Subjective   Chief Complaint: Follow-up (NEW PROBLEM - Lump on upper back, )     History of Present Illness: Sherri Martinez is a 47 y.o. female who is seen today as an office consultation at the request of Dr. Donette Larry for evaluation of Follow-up (NEW PROBLEM - Lump on upper back, ) .    This is a 47 year old female who presents with a 22-month history of a slowly enlarging mass on the left upper thoracic back.  This has caused some discomfort.  This has never become infected.  No overlying skin changes.  She had an ultrasound recently that showed a subcutaneous mass measuring 3.6 x 0.7 x 2.6 cm.  This corresponds with the lipoma.  She presents now to discuss excision.  Review of Systems: [name].  I have reviewed this information and discussed as appropriate with the patient.  See HPI as well for other ROS.  Review of Systems  Constitutional: Negative.   HENT: Negative.    Eyes: Negative.   Respiratory: Negative.    Cardiovascular: Negative.   Gastrointestinal: Negative.   Genitourinary: Negative.   Musculoskeletal: Negative.   Skin: Negative.   Neurological: Negative.   Endo/Heme/Allergies: Negative.   Psychiatric/Behavioral: Negative.        Medical History: Past Medical History:  Diagnosis Date   Anemia    Anxiety    DVT (deep venous thrombosis) (CMS/HHS-HCC)    Thyroid disease     Patient Active Problem List  Diagnosis   Gastroesophageal reflux disease without esophagitis   Hamstring strain   Globus pharyngeus    History reviewed. No pertinent surgical history.   Allergies  Allergen Reactions   Clarithromycin Nausea And Vomiting   Latex Unknown   Other Unknown    cyclens   Tetracycline Other (See Comments)    dizziness   Doxycycline Rash    Current Outpatient Medications on File Prior to Visit  Medication Sig Dispense Refill   aspirin-calcium carbonate 81 mg-300 mg calcium(777 mg) Tab Take by mouth      busPIRone (BUSPAR) 5 MG tablet      ergocalciferol, vitamin D2, 1,250 mcg (50,000 unit) capsule Take 50,000 Units by mouth every 7 (seven) days     No current facility-administered medications on file prior to visit.    History reviewed. No pertinent family history.   Social History   Tobacco Use  Smoking Status Never  Smokeless Tobacco Never     Social History   Socioeconomic History   Marital status: Married  Tobacco Use   Smoking status: Never   Smokeless tobacco: Never  Substance and Sexual Activity   Alcohol use: Not Currently   Drug use: Never    Objective:   Physical Exam   Constitutional:  WDWN in NAD, conversant, no obvious deformities; lying in bed comfortably Eyes:  Pupils equal, round; sclera anicteric; moist conjunctiva; no lid lag HENT:  Oral mucosa moist; good dentition  Neck:  No masses palpated, trachea midline; no thyromegaly Lungs:  CTA bilaterally; normal respiratory effort CV:  Regular rate and rhythm; no murmurs; extremities well-perfused with no edema Abd:  +bowel sounds, soft, non-tender, no palpable organomegaly; no palpable hernias Musc:  Unable to assess gait; no apparent clubbing or cyanosis in extremities Lymphatic:  No palpable cervical or axillary lymphadenopathy Skin:  Warm, dry; no sign of jaundice.  In the left upper thoracic back just off the midline, the patient has a visible protruding 3 cm subcutaneous  mass.  No overlying skin changes.  No sign of infection or inflammation.  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  Potential risks, benefits, alternative treatments, and expected outcomes have been explained.  All of the patient's questions at this time have  been answered.  The likelihood of reaching the patient's treatment goal is good.  The patient understand the proposed surgical procedure and wishes to proceed.   Sherri Martinez Delbert Harness, MD  05/02/2022 10:47 AM

## 2022-05-20 ENCOUNTER — Encounter: Payer: Self-pay | Admitting: Gastroenterology

## 2022-06-02 HISTORY — PX: LIPOMA EXCISION: SHX5283

## 2022-06-07 ENCOUNTER — Ambulatory Visit (AMBULATORY_SURGERY_CENTER): Payer: BC Managed Care – PPO

## 2022-06-07 VITALS — Ht 64.0 in | Wt 148.0 lb

## 2022-06-07 DIAGNOSIS — Z1211 Encounter for screening for malignant neoplasm of colon: Secondary | ICD-10-CM

## 2022-06-07 DIAGNOSIS — E0500 Thyrotoxicosis with diffuse goiter without thyrotoxic crisis or storm: Secondary | ICD-10-CM | POA: Insufficient documentation

## 2022-06-07 DIAGNOSIS — Z30432 Encounter for removal of intrauterine contraceptive device: Secondary | ICD-10-CM | POA: Insufficient documentation

## 2022-06-07 DIAGNOSIS — D259 Leiomyoma of uterus, unspecified: Secondary | ICD-10-CM | POA: Insufficient documentation

## 2022-06-07 MED ORDER — NA SULFATE-K SULFATE-MG SULF 17.5-3.13-1.6 GM/177ML PO SOLN: 1.0000 | Freq: Once | ORAL | 0 refills | Status: AC

## 2022-06-07 NOTE — Progress Notes (Signed)
No egg or soy allergy known to patient  No issues known to pt with past sedation with any surgeries or procedures  Patient denies ever being told they had issues or difficulty with intubation  No FH of Malignant Hyperthermia Pt is not on diet pills Pt is not on  home 02  Pt is not on blood thinners  Pt denies issues with constipation  No A fib or A flutter Have any cardiac testing pending--no  Pt is ambulatory   Patient's chart reviewed by Cathlyn Parsons CNRA prior to previsit and patient appropriate for the [facility].  Previsit completed and red dot placed by patient's name on their procedure day (on provider's schedule).      PV completed. Prep instructions reviewed and sent via mychart and to home address. Goodrx coupon for [facility] provided if needed. Pt to review instructions and call back with any questions,  Pt instructed to use [email or web address] or GoodRx for a price reduction on prep

## 2022-06-08 ENCOUNTER — Encounter: Payer: Self-pay | Admitting: Gastroenterology

## 2022-06-16 ENCOUNTER — Encounter: Payer: BC Managed Care – PPO | Admitting: Gastroenterology

## 2022-06-22 ENCOUNTER — Other Ambulatory Visit: Payer: Self-pay | Admitting: Family

## 2022-06-22 DIAGNOSIS — N644 Mastodynia: Secondary | ICD-10-CM

## 2022-07-01 ENCOUNTER — Ambulatory Visit
Admission: RE | Admit: 2022-07-01 | Discharge: 2022-07-01 | Disposition: A | Discharge status: Home / Self Care | Payer: BC Managed Care – PPO | Source: Ambulatory Visit | Attending: Family | Admitting: Family

## 2022-07-01 ENCOUNTER — Ambulatory Visit: Payer: BC Managed Care – PPO

## 2022-07-01 DIAGNOSIS — N644 Mastodynia: Secondary | ICD-10-CM

## 2022-07-05 ENCOUNTER — Telehealth: Payer: Self-pay | Admitting: Gastroenterology

## 2022-07-05 NOTE — Telephone Encounter (Signed)
Call to pt, pt states she is picking up suprep today, pt instructions sent via mychart per pt request.

## 2022-07-05 NOTE — Telephone Encounter (Signed)
Inbound call from patient requesting updated prep instructions for 7/9 colonoscopy be sent in mychart. Please advise, thank you.

## 2022-07-11 IMAGING — MG DIGITAL DIAGNOSTIC BILAT W/ TOMO W/ CAD
6 of 12 series · 6 of 36 positions shown · non-contrast
Comparison: Previous exam(s).

CLINICAL DATA: Focal pain in the UPPER-OUTER QUADRANT of the LEFT
breast.

EXAM:
DIGITAL DIAGNOSTIC BILATERAL MAMMOGRAM WITH TOMOSYNTHESIS AND CAD;
ULTRASOUND LEFT BREAST LIMITED
TECHNIQUE: Bilateral digital diagnostic mammography and breast tomosynthesis
was performed. The images were evaluated with computer-aided
detection.; Targeted ultrasound examination of the left breast was
performed.

[L MLO synth-2D (1 of 2)]
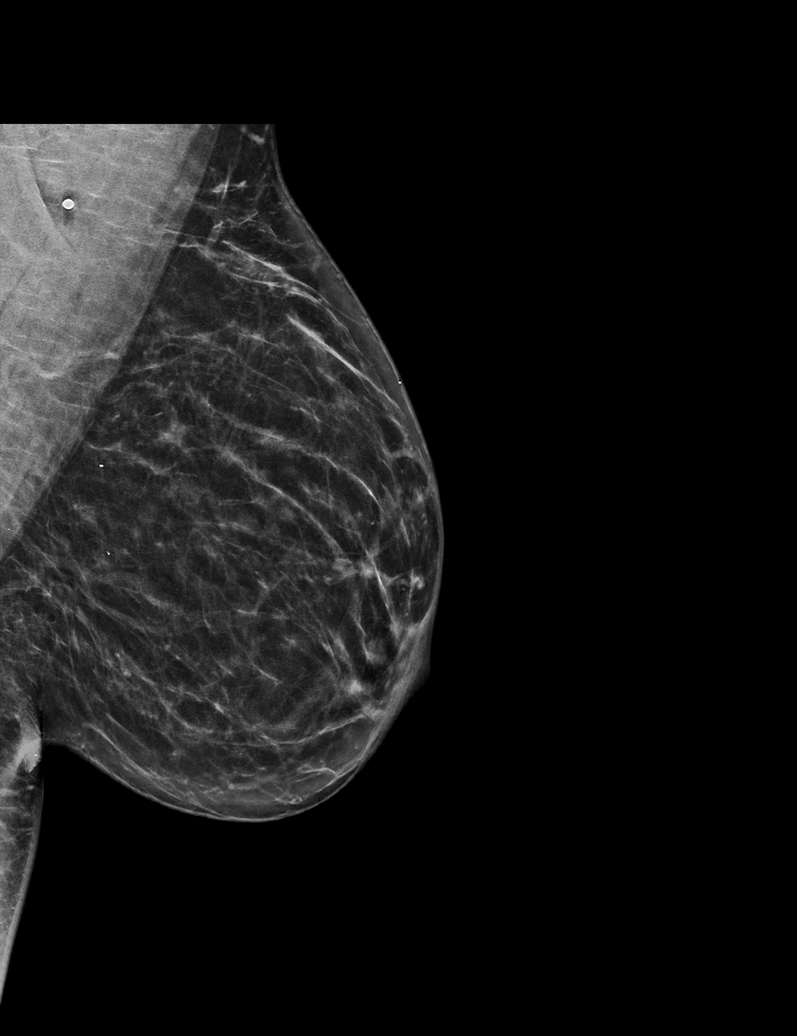

[R MLO synth-2D]
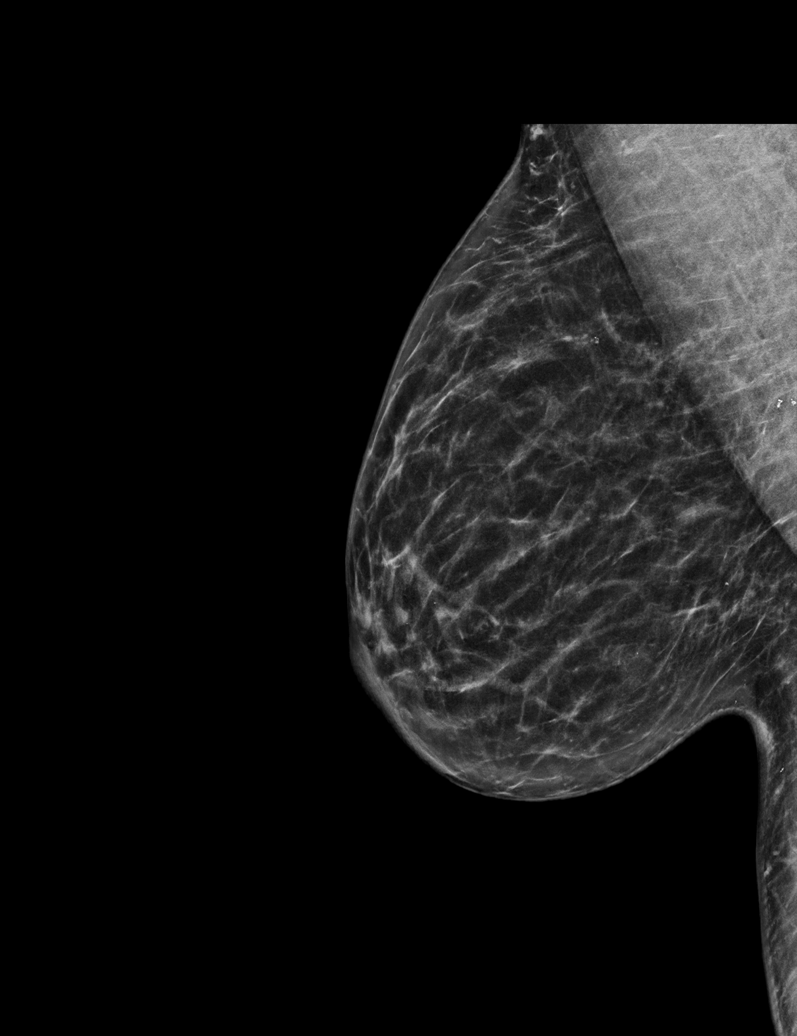

[R CC synth-2D (1 of 2)]
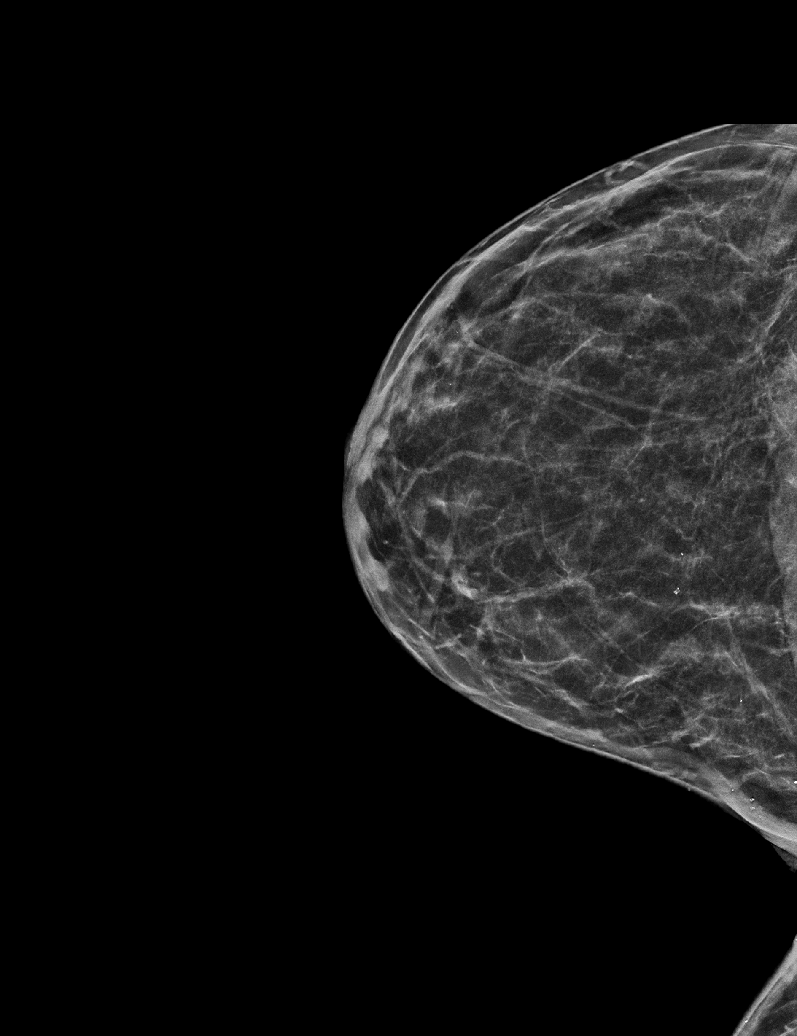

[L MLO synth-2D (2 of 2)]
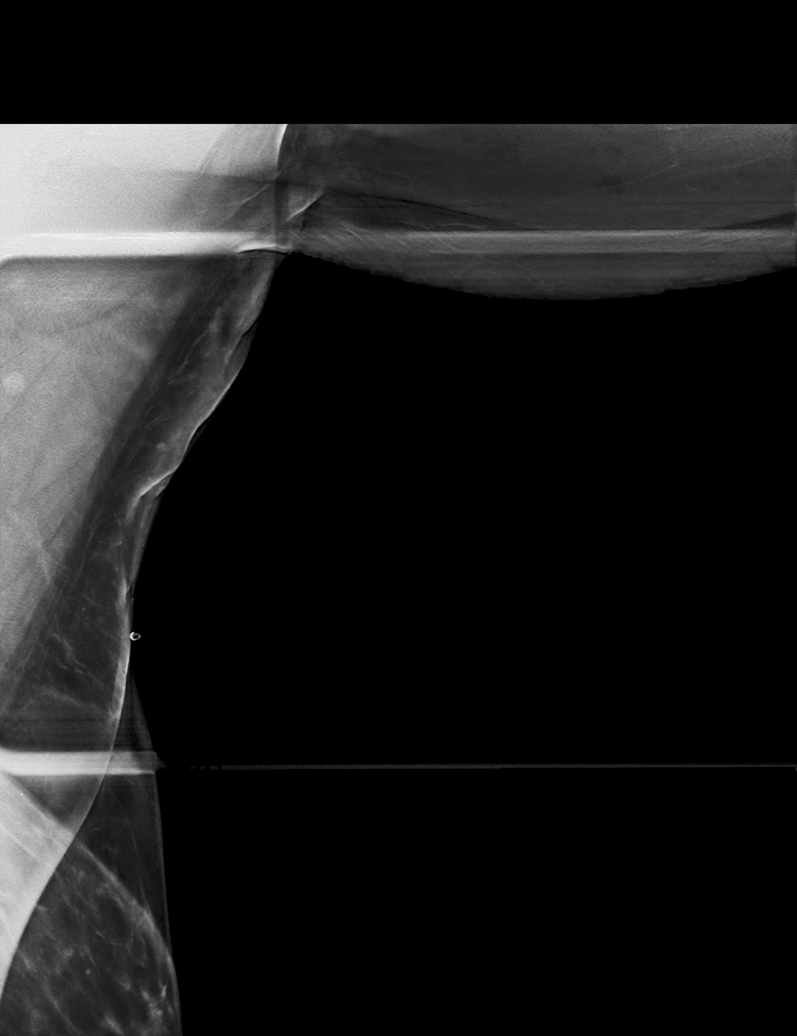

[R CC synth-2D (2 of 2)]
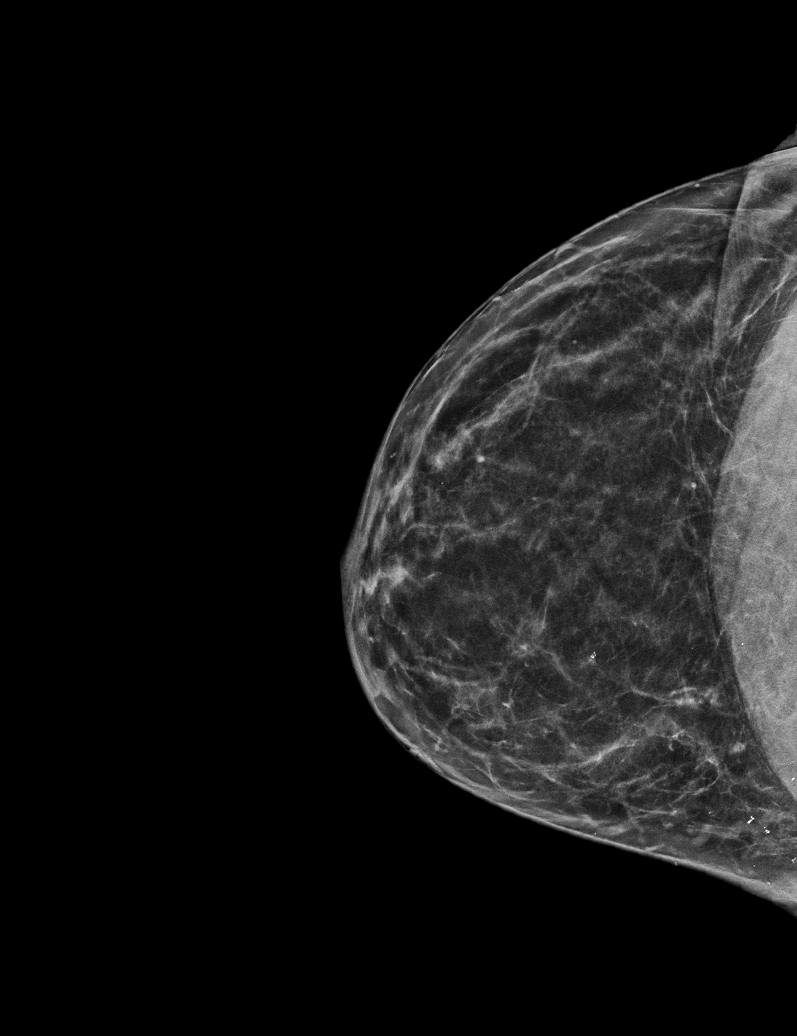

[L CC synth-2D]
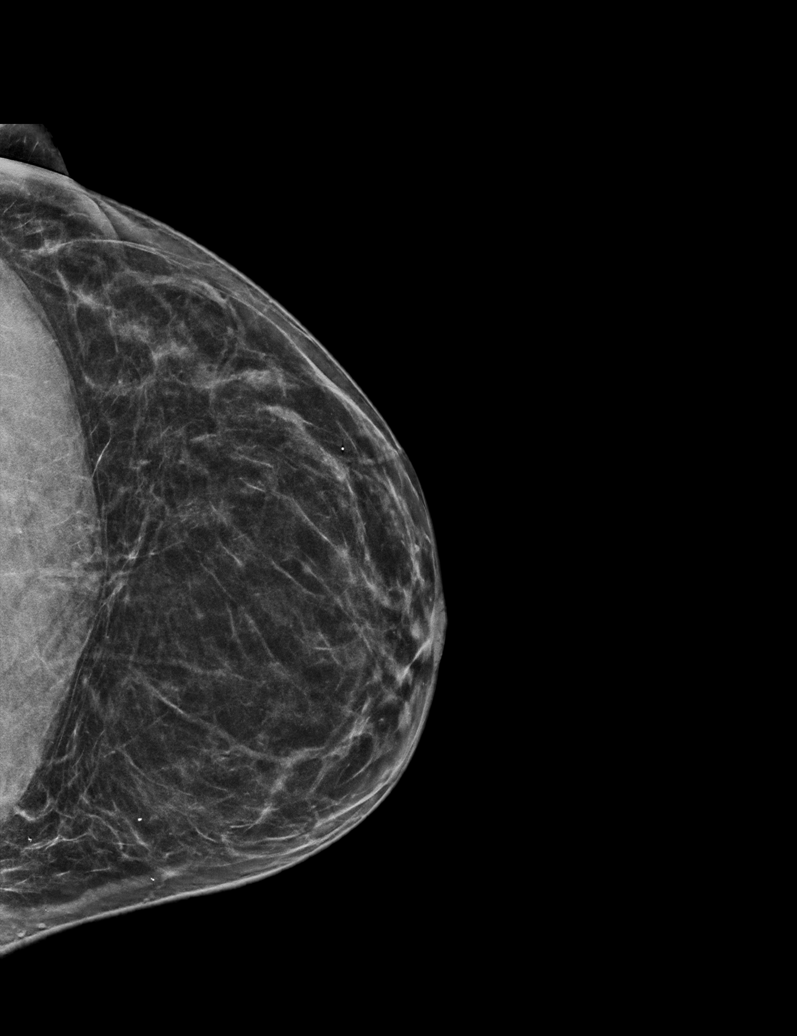

[6 of 36 positions shown; findings below may reference images not displayed]

ACR Breast Density Category b: There are scattered areas of
fibroglandular density.
FINDINGS: No suspicious mass, distortion, or microcalcifications are
identified to suggest presence of malignancy. Spot tangential view
is performed in the area of patient's concern shows normal appearing
fibrofatty tissue.

On physical exam, I palpate no abnormality in the UPPER-OUTER
QUADRANT of the LEFT breast.

Targeted ultrasound is performed, showing normal appearing
fibrofatty tissue throughout the UPPER OUTER QUADRANT of the breast.
No suspicious mass, distortion, or acoustic shadowing is
demonstrated with ultrasound.
IMPRESSION: No mammographic or ultrasound evidence for malignancy.

RECOMMENDATION:
Screening mammogram in one year.(Code:AI-E-8G7)

I have discussed the findings and recommendations with the patient.
If applicable, a reminder letter will be sent to the patient
regarding the next appointment.

BI-RADS CATEGORY  1: Negative.

## 2022-07-12 ENCOUNTER — Ambulatory Visit (AMBULATORY_SURGERY_CENTER): Payer: BC Managed Care – PPO | Admitting: Gastroenterology

## 2022-07-12 ENCOUNTER — Encounter: Payer: Self-pay | Admitting: Gastroenterology

## 2022-07-12 VITALS — BP 117/84 | HR 61 | Temp 98.6°F | Resp 13 | Ht 64.0 in | Wt 148.0 lb

## 2022-07-12 DIAGNOSIS — D123 Benign neoplasm of transverse colon: Secondary | ICD-10-CM | POA: Diagnosis not present

## 2022-07-12 DIAGNOSIS — D128 Benign neoplasm of rectum: Secondary | ICD-10-CM | POA: Diagnosis not present

## 2022-07-12 DIAGNOSIS — Z1211 Encounter for screening for malignant neoplasm of colon: Secondary | ICD-10-CM

## 2022-07-12 MED ORDER — SODIUM CHLORIDE 0.9 % IV SOLN
500.00 mL | INTRAVENOUS | Status: DC
Start: 2022-07-12 — End: 2022-07-12

## 2022-07-12 NOTE — Progress Notes (Signed)
Sedate, gd SR, tolerated procedure well, VSS, report to RN 

## 2022-07-12 NOTE — Progress Notes (Signed)
History & Physical  Primary Care Physician:  Georgann Housekeeper, MD Primary Gastroenterologist: Claudette Head, MD  Impression / Plan:  Average risk CRC screening for colonoscopy.  CHIEF COMPLAINT:  CRC screening  HPI: FELICIANA NARAYAN is a 47 y.o. female average risk CRC screening for colonoscopy.    Past Medical History:  Diagnosis Date   Grave's disease    History of blood clots 2007   left arm    Past Surgical History:  Procedure Laterality Date   CESAREAN SECTION  2009, 2010   x 2   diastasis repair  2011   LIPOMA EXCISION  06/02/2022    Prior to Admission medications   Medication Sig Start Date End Date Taking? Authorizing Provider  levothyroxine (SYNTHROID) 100 MCG tablet Take 1 tablet (100 mcg total) by mouth in the morning on an empty stomach for 6 day/week 01/27/22  Yes   Levothyroxine Sodium 88 MCG CAPS Take 88 mcg by mouth once a week. Every Sunday   Yes [provider]  norethindrone (MICRONOR) 0.35 MG tablet Take 1 tablet by mouth daily. 03/27/22  Yes [provider]  albuterol (VENTOLIN HFA) 108 (90 Base) MCG/ACT inhaler Inhale 2 puffs into the lungs every 6 (six) hours as needed.    [provider]  aspirin 81 MG tablet Take 81 mg by mouth 3 (three) times a week. 3 time weekly then every Saturday or Sunday    [provider]  busPIRone (BUSPAR) 5 MG tablet Take 5 mg by mouth 2 (two) times daily. 03/17/22   [provider]  ibuprofen (ADVIL) 800 MG tablet Take 800 mg by mouth every 8 (eight) hours as needed. 12/10/21   [provider]  Multiple Vitamins-Minerals (MULTI ADULT GUMMIES PO)     [provider]  UNABLE TO FIND as needed. Med Name: Fluocinolone Acetonide ear drops    [provider]  Vitamin D, Ergocalciferol, (DRISDOL) 1.25 MG (50000 UNIT) CAPS capsule Take 50,000 Units by mouth every 30 (thirty) days. 01/30/22   [provider]    Current Outpatient Medications  Medication  Sig Dispense Refill   levothyroxine (SYNTHROID) 100 MCG tablet Take 1 tablet (100 mcg total) by mouth in the morning on an empty stomach for 6 day/week 26 tablet 5   Levothyroxine Sodium 88 MCG CAPS Take 88 mcg by mouth once a week. Every Sunday     norethindrone (MICRONOR) 0.35 MG tablet Take 1 tablet by mouth daily.     albuterol (VENTOLIN HFA) 108 (90 Base) MCG/ACT inhaler Inhale 2 puffs into the lungs every 6 (six) hours as needed.     aspirin 81 MG tablet Take 81 mg by mouth 3 (three) times a week. 3 time weekly then every Saturday or Sunday     busPIRone (BUSPAR) 5 MG tablet Take 5 mg by mouth 2 (two) times daily.     ibuprofen (ADVIL) 800 MG tablet Take 800 mg by mouth every 8 (eight) hours as needed.     Multiple Vitamins-Minerals (MULTI ADULT GUMMIES PO)      UNABLE TO FIND as needed. Med Name: Fluocinolone Acetonide ear drops     Vitamin D, Ergocalciferol, (DRISDOL) 1.25 MG (50000 UNIT) CAPS capsule Take 50,000 Units by mouth every 30 (thirty) days.     Current Facility-Administered Medications  Medication Dose Route Frequency Provider Last Rate Last Admin   0.9 %  sodium chloride infusion  500 mL Intravenous Continuous Meryl Dare, MD  Allergies as of 07/12/2022 - Review Complete 07/12/2022  Allergen Reaction Noted   Benzoyl peroxide  06/07/2022   Clarithromycin Nausea And Vomiting 08/17/2017   Latex Itching 01/12/2011   Other  01/12/2011   Tetracycline  08/17/2017   Tetracyclines & related Other (See Comments) 08/17/2017   Doxycycline Rash and Nausea And Vomiting 07/04/2017    Family History  Problem Relation Age of Onset   Hypertension Mother    Thyroid disease Mother    Hypertension Father    Colon cancer Neg Hx    Colon polyps Neg Hx    Esophageal cancer Neg Hx    Rectal cancer Neg Hx    Stomach cancer Neg Hx     Social History   Socioeconomic History   Marital status: Married    Spouse name: Not on file   Number of children: 4   Years of  education: DPT   Highest education level: Not on file  Occupational History    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[employer], school system  Tobacco Use   Smoking status: Never   Smokeless tobacco: Never  Vaping Use   Vaping Use: Never used  Substance and Sexual Activity   Alcohol use: Yes    Comment: occasionally   Drug use: Never   Sexual activity: Not on file  Other Topics Concern   Not on file  Social History Narrative   Lives with family   Caffeine- 2 x week   Social Determinants of Health   Financial Resource Strain: Not on file  Food Insecurity: Not on file  Transportation Needs: Not on file  Physical Activity: Not on file  Stress: Not on file  Social Connections: Not on file  Intimate Partner Violence: Not on file    Review of Systems:  All systems reviewed were negative except where noted in HPI.   Physical Exam:  General:  Alert, well-developed, in NAD Head:  Normocephalic and atraumatic. Eyes:  Sclera clear, no icterus.   Conjunctiva pink. Ears:  Normal auditory acuity. Mouth:  No deformity or lesions.  Neck:  Supple; no masses. Lungs:  Clear throughout to auscultation.   No wheezes, crackles, or rhonchi.  Heart:  Regular rate and rhythm; no murmurs. Abdomen:  Soft, nondistended, nontender. No masses, hepatomegaly. No palpable masses.  Normal bowel sounds.    Rectal:  Deferred   Msk:  Symmetrical without gross deformities. Extremities:  Without edema. Neurologic:  Alert and  oriented x 4; grossly normal neurologically. Skin:  Intact without significant lesions or rashes. Psych:  Alert and cooperative. Normal mood and affect.   Venita Lick. Russella Dar  07/12/2022, 3:43 PM See Loretha Stapler, [facility], to contact our on call provider

## 2022-07-12 NOTE — Patient Instructions (Signed)
Discharge instructions given. Handout on polyps. Resume previous medications. YOU HAD AN ENDOSCOPIC PROCEDURE TODAY AT THE [facility]:   Refer to the procedure report that was given to you for any specific questions about what was found during the examination.  If the procedure report does not answer your questions, please call your gastroenterologist to clarify.  If you requested that your care partner not be given the details of your procedure findings, then the procedure report has been included in a sealed envelope for you to review at your convenience later.  YOU SHOULD EXPECT: Some feelings of bloating in the abdomen. Passage of more gas than usual.  Walking can help get rid of the air that was put into your GI tract during the procedure and reduce the bloating. If you had a lower endoscopy (such as a colonoscopy or flexible sigmoidoscopy) you may notice spotting of blood in your stool or on the toilet paper. If you underwent a bowel prep for your procedure, you may not have a normal bowel movement for a few days.  Please Note:  You might notice some irritation and congestion in your nose or some drainage.  This is from the oxygen used during your procedure.  There is no need for concern and it should clear up in a day or so.  SYMPTOMS TO REPORT IMMEDIATELY:  Following lower endoscopy (colonoscopy or flexible sigmoidoscopy):  Excessive amounts of blood in the stool  Significant tenderness or worsening of abdominal pains  Swelling of the abdomen that is new, acute  Fever of 100F or higher   For urgent or emergent issues, a gastroenterologist can be reached at any hour by calling [phone]. Do not use MyChart messaging for urgent concerns.    DIET:  We do recommend a small meal at first, but then you may proceed to your regular diet.  Drink plenty of fluids but you should avoid alcoholic beverages for 24 hours.  ACTIVITY:  You should plan to take it easy for the rest  of today and you should NOT DRIVE or use heavy machinery until tomorrow (because of the sedation medicines used during the test).    FOLLOW UP: Our staff will call the number listed on your records the next business day following your procedure.  We will call around 7:15- 8:00 am to check on you and address any questions or concerns that you may have regarding the information given to you following your procedure. If we do not reach you, we will leave a message.     If any biopsies were taken you will be contacted by phone or by letter within the next 1-3 weeks.  Please call us at [phone] if you have not heard about the biopsies in 3 weeks.    SIGNATURES/CONFIDENTIALITY: You and/or your care partner have signed paperwork which will be entered into your electronic medical record.  These signatures attest to the fact that that the information above on your After Visit Summary has been reviewed and is understood.  Full responsibility of the confidentiality of this discharge information lies with you and/or your care-partner.   

## 2022-07-12 NOTE — Op Note (Signed)
[facility] Patient Name: Sherri Martinez Procedure Date: 07/12/2022 3:47 PM MRN: [id number] Endoscopist: Meryl Dare , MD, [phone] Age: 47 Referring MD:  Date of Birth: Oct 06, 1975 Gender: Female Account #: [id number] Procedure:                Colonoscopy Indications:              Screening for colorectal malignant neoplasm Medicines:                Monitored Anesthesia Care Procedure:                Pre-Anesthesia Assessment:                           - Prior to the procedure, a History and Physical                            was performed, and patient medications and                            allergies were reviewed. The patient's tolerance of                            previous anesthesia was also reviewed. The risks                            and benefits of the procedure and the sedation                            options and risks were discussed with the patient.                            All questions were answered, and informed consent                            was obtained. Prior Anticoagulants: The patient has                            taken no anticoagulant or antiplatelet agents. ASA                            Grade Assessment: II - A patient with mild systemic                            disease. After reviewing the risks and benefits,                            the patient was deemed in satisfactory condition to                            undergo the procedure.                           After obtaining informed consent, the colonoscope  was passed under direct vision. Throughout the                            procedure, the patient's blood pressure, pulse, and                            oxygen saturations were monitored continuously. The                            Olympus PCF-[id number] (#[id number]) Colonoscope was                            introduced through the anus and advanced to the the                            cecum, identified  by appendiceal orifice and                            ileocecal valve. The ileocecal valve, appendiceal                            orifice, and rectum were photographed. The quality                            of the bowel preparation was good. The colonoscopy                            was performed without difficulty. The patient                            tolerated the procedure well. Scope In: 3:51:03 PM Scope Out: 4:07:54 PM Scope Withdrawal Time: 0 hours 13 minutes 59 seconds  Total Procedure Duration: 0 hours 16 minutes 51 seconds  Findings:                 The perianal and digital rectal examinations were                            normal.                           Two sessile polyps were found in the proximal                            rectum and transverse colon. The polyps were 6 to 7                            mm in size. These polyps were removed with a cold                            snare. Resection and retrieval were complete.                           The exam was otherwise without abnormality on  direct and retroflexion views. Complications:            No immediate complications. Estimated blood loss:                            None. Estimated Blood Loss:     Estimated blood loss: none. Impression:               - Two 6 to 7 mm polyps in the proximal rectum and                            in the transverse colon, removed with a cold snare.                            Resected and retrieved.                           - The examination was otherwise normal on direct                            and retroflexion views. Recommendation:           - Repeat colonoscopy after studies are complete for                            surveillance based on pathology results.                           - Patient has a contact number available for                            emergencies. The signs and symptoms of potential                            delayed  complications were discussed with the                            patient. Return to normal activities tomorrow.                            Written discharge instructions were provided to the                            patient.                           - Resume previous diet.                           - Continue present medications.                           - Await pathology results. Meryl Dare, MD 07/12/2022 4:10:04 PM This report has been signed electronically.

## 2022-07-12 NOTE — Progress Notes (Signed)
Called to room to assist during endoscopic procedure.  Patient ID and intended procedure confirmed with present staff. Received instructions for my participation in the procedure from the performing physician.  

## 2022-07-13 ENCOUNTER — Telehealth: Payer: Self-pay | Admitting: *Deleted

## 2022-07-13 NOTE — Telephone Encounter (Signed)
Left message on f/u call 

## 2022-07-25 ENCOUNTER — Encounter: Payer: Self-pay | Admitting: Gastroenterology

## 2022-08-02 ENCOUNTER — Encounter: Payer: Self-pay | Admitting: Gastroenterology

## 2022-10-17 ENCOUNTER — Other Ambulatory Visit (HOSPITAL_BASED_OUTPATIENT_CLINIC_OR_DEPARTMENT_OTHER): Payer: Self-pay

## 2022-10-17 MED ORDER — INFLUENZA VIRUS VACC SPLIT PF (FLUZONE) 0.5 ML IM SUSY
0.5000 mL | PREFILLED_SYRINGE | Freq: Once | INTRAMUSCULAR | 0 refills | Status: AC
  Filled 2022-10-17: qty 0.5, 1d supply, fill #0

## 2023-01-12 ENCOUNTER — Other Ambulatory Visit: Payer: Self-pay | Admitting: Obstetrics and Gynecology

## 2023-01-12 DIAGNOSIS — N63 Unspecified lump in unspecified breast: Secondary | ICD-10-CM

## 2023-01-17 ENCOUNTER — Ambulatory Visit
Admission: RE | Admit: 2023-01-17 | Discharge: 2023-01-17 | Disposition: A | Discharge status: Home / Self Care | Payer: Self-pay | Source: Ambulatory Visit | Attending: Obstetrics and Gynecology | Admitting: Obstetrics and Gynecology

## 2023-01-17 DIAGNOSIS — N63 Unspecified lump in unspecified breast: Secondary | ICD-10-CM

## 2023-09-06 ENCOUNTER — Encounter (HOSPITAL_COMMUNITY): Payer: Self-pay

## 2023-09-06 ENCOUNTER — Ambulatory Visit (HOSPITAL_COMMUNITY)
Admission: EM | Admit: 2023-09-06 | Discharge: 2023-09-06 | Disposition: A | Discharge status: Home / Self Care | Attending: Internal Medicine | Admitting: Internal Medicine

## 2023-09-06 DIAGNOSIS — H1013 Acute atopic conjunctivitis, bilateral: Secondary | ICD-10-CM | POA: Diagnosis not present

## 2023-09-06 DIAGNOSIS — T7840XA Allergy, unspecified, initial encounter: Secondary | ICD-10-CM

## 2023-09-06 DIAGNOSIS — H5789 Other specified disorders of eye and adnexa: Secondary | ICD-10-CM

## 2023-09-06 MED ORDER — PREDNISONE 20 MG PO TABS: 40.0000 mg | ORAL_TABLET | Freq: Every day | ORAL | 0 refills | Status: AC

## 2023-09-06 MED ORDER — CETIRIZINE HCL 10 MG PO TABS
ORAL_TABLET | ORAL | Status: AC
  Filled 2023-09-06: qty 1

## 2023-09-06 MED ORDER — OLOPATADINE HCL 0.1 % OP SOLN: 1.0000 [drp] | Freq: Two times a day (BID) | OPHTHALMIC | 12 refills | Status: AC

## 2023-09-06 MED ORDER — CETIRIZINE HCL 10 MG PO TABS: 10.0000 mg | ORAL_TABLET | Freq: Every day | ORAL | 0 refills | Status: DC

## 2023-09-06 MED ORDER — FAMOTIDINE 20 MG PO TABS
ORAL_TABLET | ORAL | Status: AC
  Filled 2023-09-06: qty 1

## 2023-09-06 MED ORDER — METHYLPREDNISOLONE SODIUM SUCC 125 MG IJ SOLR
125.0000 mg | Freq: Once | INTRAMUSCULAR | Status: AC
  Administered 2023-09-06: 125 mg via INTRAMUSCULAR

## 2023-09-06 MED ORDER — FAMOTIDINE 20 MG PO TABS: 20.0000 mg | ORAL_TABLET | Freq: Every day | ORAL | 0 refills | Status: DC

## 2023-09-06 MED ORDER — METHYLPREDNISOLONE SODIUM SUCC 125 MG IJ SOLR
INTRAMUSCULAR | Status: AC
  Filled 2023-09-06: qty 2

## 2023-09-06 MED ORDER — EPINEPHRINE 0.3 MG/0.3ML IJ SOAJ: 0.3000 mg | INTRAMUSCULAR | 0 refills | Status: AC | PRN

## 2023-09-06 MED ORDER — CETIRIZINE HCL 10 MG PO TABS
10.0000 mg | ORAL_TABLET | Freq: Every day | ORAL | Status: DC
  Administered 2023-09-06: 10 mg via ORAL

## 2023-09-06 MED ORDER — FAMOTIDINE 20 MG PO TABS
20.0000 mg | ORAL_TABLET | Freq: Once | ORAL | Status: AC
  Administered 2023-09-06: 20 mg via ORAL

## 2023-09-06 NOTE — ED Triage Notes (Signed)
 Pt present with an allergic reaction.  Pt states she sitting in the teachers lounge and started sneezing, has itchy eyes. Pt states she hasn't taken anything that could give her a reaction. Present with bilateral eye swelling. States she has taken benadryl around 1 hr ago.

## 2023-09-06 NOTE — Discharge Instructions (Signed)
 You have been evaluated today for an allergic reaction.  We gave you an injection of steroid to start treating the allergic reaction today.   Take steroid pills starting tomorrow as prescribed (prednisone - 2 pills once daily for 5 days).   Take cetirizine  (Zyrtec ) 10 mg once daily at bedtime for 7 days. Take famotidine  (Pepcid ) 20 mg once daily at bedtime for 7 days.  A referral has been placed to allergy and asthma specialist at [facility]. Please call [place] allergy and asthma specialist when you leave the clinic today to set up an appointment for allergy testing. Return if you experience rashes, difficulty breathing or swallowing, lip/mouth/tongue swelling, vomiting, or for any other concerning symptoms. If symptoms are severe, please go to the ER for further workup.

## 2023-09-06 NOTE — ED Provider Notes (Signed)
 [facility]    CSN: [id number] Arrival date & time: 09/06/23  1326      History   Chief Complaint Chief Complaint  Patient presents with   Allergic Reaction   Facial Swelling    eyes    HPI Sherri Martinez is a 48 y.o. female.   Sherri Martinez is a 48 y.o. female presenting for chief complaint of Allergic Reaction and Facial Swelling (eyes) that started 1 to 2 hours ago suddenly.  Patient was at school and the teachers lounge eating lunch when she suddenly developed watery itchy eyes, and swelling of the soft tissue of the right eye.  Right eye swelling has progressively worsened, left eye is now swollen as well.  Denies known exposures to irritants/allergens.  No recent changes in make-up, laundry detergent, lotion, personal hygiene products, medications, or intake of new foods outside of normal diet. Denies cough, congestion, fever, chills, shortness of breath, throat swelling, tongue swelling, dizziness, and vision changes.  No rash, hives, or history of anaphylaxis.  History of seasonal allergies, she does not currently take any allergy medications. History of similar symptoms 1 year ago after she was exposed to a lavender oil diffuser at school.  At that time, she experienced only right eye swelling.  She did not receive further workup with allergy specialist 1 year ago.  Wears contacts, she was able to get her contacts out of her eyes before the swelling worsened.  She is currently wearing her backup glasses.  Denies recent antibiotic/steroid use. She has not attempted treatment of symptoms at home.   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      Home Medications    Prior to Admission medications   Medication Sig Start Date End Date Taking? Authorizing Provider  cetirizine  (ZYRTEC ) 10 MG tablet Take 1 tablet (10 mg total) by mouth daily. 09/06/23  Yes Enedelia Dorna HERO, FNP  EPINEPHrine  0.3 mg/0.3 mL IJ SOAJ injection Inject 0.3 mg into the muscle as needed for anaphylaxis. 09/06/23  Yes Enedelia Dorna HERO, FNP  famotidine  (PEPCID ) 20 MG tablet Take 1 tablet (20 mg total) by mouth daily. 09/06/23  Yes Enedelia Dorna HERO, FNP  olopatadine  (PATADAY ) 0.1 % ophthalmic solution Place 1 drop into both eyes 2 (two) times daily. 09/06/23  Yes Enedelia Dorna HERO, FNP  predniSONE  (DELTASONE ) 20 MG tablet Take 2 tablets (40 mg total) by mouth daily with breakfast for 5 days. 09/06/23 09/11/23 Yes Enedelia Dorna HERO, FNP  albuterol (VENTOLIN HFA) 108 (90 Base) MCG/ACT inhaler Inhale 2 puffs into the lungs every 6 (six) hours as needed.    [provider]  aspirin 81 MG tablet Take 81 mg by mouth 3 (three) times a week. 3 time weekly then every Saturday or Sunday    [provider]  busPIRone (BUSPAR) 5 MG tablet Take 5 mg by mouth 2 (two) times daily. 03/17/22   [provider]  ibuprofen (ADVIL) 800 MG tablet Take 800 mg by mouth every 8 (eight) hours as needed. 12/10/21   [provider]  levothyroxine  (SYNTHROID ) 100 MCG tablet Take 1 tablet (100 mcg total) by mouth in the morning on an empty stomach for 6 day/week 01/27/22     Levothyroxine  Sodium 88 MCG CAPS Take 88 mcg by mouth once a week. Every Sunday    [provider]  Multiple Vitamins-Minerals (MULTI ADULT GUMMIES PO)     [provider]  norethindrone (MICRONOR) 0.35 MG tablet Take 1 tablet by mouth daily. 03/27/22   [provider]  UNABLE TO FIND as needed. Med Name: Fluocinolone Acetonide ear drops    [provider]  Vitamin D, Ergocalciferol, (DRISDOL) 1.25 MG (50000 UNIT) CAPS capsule Take 50,000 Units by mouth every 30 (thirty) days. 01/30/22   [provider]    Family History Family History  Problem Relation Age of Onset   Hypertension Mother    Thyroid  disease Mother    Hypertension Father    Colon cancer Neg Hx    Colon polyps Neg Hx    Esophageal cancer Neg Hx    Rectal cancer Neg Hx    Stomach cancer Neg Hx     Social History Social History   Tobacco Use   Smoking status: Never   Smokeless tobacco: Never  Vaping Use   Vaping status: Never Used  Substance Use Topics   Alcohol use: Yes    Comment: occasionally   Drug use: Never     Allergies   Benzoyl peroxide, Clarithromycin, Latex, Other, Tetracycline, Tetracyclines & related, and Doxycycline   Review of Systems Review of Systems Per HPI  Physical Exam Triage Vital Signs ED Triage Vitals  Encounter Vitals Group     BP 09/06/23 1339 (!) 146/94     Girls Systolic BP Percentile --      Girls Diastolic BP Percentile --      Boys Systolic BP Percentile --      Boys Diastolic BP Percentile --      Pulse Rate 09/06/23 1338 75     Resp 09/06/23 1338 16     Temp 09/06/23 1338 98 F (36.7 C)     Temp Source 09/06/23 1338 Oral     SpO2 09/06/23 1338 100 %     Weight --      Height --      Head Circumference --      Peak Flow --      Pain Score 09/06/23 1337 0     Pain Loc --      Pain Education --      Exclude from Growth Chart --    No data found.  Updated Vital Signs BP (!) 146/94 (BP Location: Right Arm)   Pulse 75   Temp 98 F (36.7 C) (Oral)   Resp 16   LMP  (LMP Unknown)   SpO2 100%   Visual Acuity Right Eye Distance:   Left Eye Distance:   Bilateral Distance:    Right Eye Near:    Left Eye Near:    Bilateral Near:     Physical Exam Vitals and nursing note reviewed.  Constitutional:      Appearance: She is not ill-appearing or toxic-appearing.  HENT:     Head: Normocephalic and atraumatic.     Right Ear:  Hearing, tympanic membrane, ear canal and external ear normal.     Left Ear: Hearing, tympanic membrane, ear canal and external ear normal.     Nose: Rhinorrhea present.     Mouth/Throat:     Lips: Pink.     Mouth: Mucous membranes are moist. No injury or oral lesions.     Dentition: Normal dentition.     Tongue: No lesions.     Pharynx: Oropharynx is clear. Uvula midline. Posterior oropharyngeal erythema present. No pharyngeal swelling, oropharyngeal exudate, uvula swelling or postnasal drip.     Tonsils: No tonsillar exudate.     Comments: No trismus, phonation normal, maintaining secretions without difficulty.  Eyes:     General: Lids are normal. Lids are everted, no foreign bodies appreciated. Vision grossly intact. Gaze aligned appropriately. No allergic shiner, visual field deficit or scleral icterus.       Right eye: Discharge present. No foreign body.        Left eye: Discharge present.No foreign body.     Extraocular Movements: Extraocular movements intact.     Conjunctiva/sclera: Conjunctivae normal.     Right eye: Right conjunctiva is not injected. No chemosis, exudate or hemorrhage.    Left eye: Left conjunctiva is not injected. No chemosis, exudate or hemorrhage.    Comments: Watery discharge from bilateral eyes, significant soft tissue swelling to the periorbital region bilaterally, see image below. EOMs intact without pain or dizziness.  Neck:     Trachea: Trachea and phonation normal.  Cardiovascular:     Rate and Rhythm: Normal rate and regular rhythm.     Heart sounds: Normal heart sounds, S1 normal and S2 normal.  Pulmonary:     Effort: Pulmonary effort is normal. No respiratory distress.     Breath sounds: Normal breath sounds and air  entry. No wheezing, rhonchi or rales.     Comments: Speaks in full sentences without difficulty. Chest:     Chest wall: No tenderness.  Musculoskeletal:     Cervical back: Neck supple.  Lymphadenopathy:     Cervical: No cervical adenopathy.  Skin:    General: Skin is warm and dry.     Capillary Refill: Capillary refill takes less than 2 seconds.     Findings: No rash.  Neurological:     General: No focal deficit present.     Mental Status: She is alert and oriented to person, place, and time. Mental status is at baseline.     Cranial Nerves: No dysarthria or facial asymmetry.  Psychiatric:        Mood and Affect: Mood normal.        Speech: Speech normal.        Behavior: Behavior normal.        Thought Content: Thought content normal.        Judgment: Judgment normal.       UC Treatments / Results  Labs (all labs ordered are listed, but only abnormal results are displayed) Labs Reviewed - No data to display  EKG   Radiology No results found.  Procedures Procedures (including critical care time)  Medications Ordered in UC Medications  cetirizine  (ZYRTEC ) tablet 10 mg (10 mg Oral Given 09/06/23 1355)  methylPREDNISolone  sodium succinate (SOLU-MEDROL ) 125 mg/2 mL injection 125 mg (125 mg Intramuscular Given 09/06/23 1355)  famotidine  (PEPCID ) tablet 20 mg (20 mg Oral Given 09/06/23 1355)    Initial Impression / Assessment and Plan / UC Course  I have reviewed the triage vital signs  and the nursing notes.  Pertinent labs & imaging results that were available during my care of the patient were reviewed by me and considered in my medical decision making (see chart for details).   1.  Allergic reaction, periorbital swelling, allergic conjunctivitis of both eyes Presentation consistent with acute hypersensitivity reaction, likely acute allergic reaction.  No signs of anaphylaxis, HEENT exam stable, lungs clear.  Will treat with steroids, antihistamines, H2 blocker  (famotidine ) and supportive care as outlined in AVS. Given solumedrol 125 mg in clinic for acute periorbital swelling with significant improvement in swelling 40 minutes after injection was given on reassessment.    Prednisone  burst to be started tomorrow. Zyrtec  and Pepcid  given today in clinic, take Zyrtec  and Pepcid  daily for 7 days to further suppress allergic reaction.  Recommend follow-up evaluation with allergy and asthma specialist to determine trigger.  Allergic reaction is worse this time than last year.  No current tongue or throat swelling/shortness of breath, however given we do not know allergen that is causing this reaction, I'd like for her to have an epipen  in case she develops anaphylaxis symptoms. Patient is a reliable and verbalizes understanding and agreement with plan on how to use EpiPen .  If she needs to use EpiPen , she is only to use this for full body hives, throat swelling, tongue/lip swelling and she has to call 911 after using.  Her son has an EpiPen  and I trust that she is able to use this appropriately.   Work note given.   Counseled patient on potential for adverse effects with medications prescribed/recommended today, strict ER and return-to-clinic precautions discussed, patient verbalized understanding.    Final Clinical Impressions(s) / UC Diagnoses   Final diagnoses:  Allergic reaction, initial encounter  Periorbital swelling  Allergic conjunctivitis of both eyes     Discharge Instructions      You have been evaluated today for an allergic reaction.  We gave you an injection of steroid to start treating the allergic reaction today.   Take steroid pills starting tomorrow as prescribed (prednisone - 2 pills once daily for 5 days).   Take cetirizine  (Zyrtec ) 10 mg once daily at bedtime for 7 days. Take famotidine  (Pepcid ) 20 mg once daily at bedtime for 7 days.  A referral has been placed to allergy and asthma specialist at [facility]  [facility]. Please call [place] allergy and asthma specialist when you leave the clinic today to set up an appointment for allergy testing. Return if you experience rashes, difficulty breathing or swallowing, lip/mouth/tongue swelling, vomiting, or for any other concerning symptoms. If symptoms are severe, please go to the ER for further workup.      ED Prescriptions     Medication Sig Dispense Auth. Provider   EPINEPHrine  0.3 mg/0.3 mL IJ SOAJ injection Inject 0.3 mg into the muscle as needed for anaphylaxis. 1 each Enedelia Dorna HERO, FNP   predniSONE  (DELTASONE ) 20 MG tablet Take 2 tablets (40 mg total) by mouth daily with breakfast for 5 days. 10 tablet Enedelia Dorna M, FNP   olopatadine  (PATADAY ) 0.1 % ophthalmic solution Place 1 drop into both eyes 2 (two) times daily. 5 mL Enedelia Dorna M, FNP   cetirizine  (ZYRTEC ) 10 MG tablet Take 1 tablet (10 mg total) by mouth daily. 7 tablet Enedelia Dorna HERO, FNP   famotidine  (PEPCID ) 20 MG tablet Take 1 tablet (20 mg total) by mouth daily. 7 tablet Enedelia Dorna HERO, FNP      PDMP not reviewed this encounter.  Enedelia Dorna HERO, [place] 09/06/23 [id number]

## 2023-09-07 ENCOUNTER — Telehealth (HOSPITAL_COMMUNITY): Payer: Self-pay

## 2023-09-07 NOTE — Telephone Encounter (Signed)
 Patient informed and verbalized understanding

## 2023-09-07 NOTE — Telephone Encounter (Signed)
 Informed by front desk staff: hello. the above pt called and stated [facility] needs approval for medication. pt contact number [phone].   Called and spoke to the pharmacy. Was informed that Patient's insurance will not cover the EPI Pin but only the Adrenaclick  the generic. States they are not able to switch the order without a verbal.   Verbal given to the pharmacy to change medication. They verbalized understanding and will fill the generic.

## 2023-09-14 ENCOUNTER — Ambulatory Visit: Payer: Self-pay | Admitting: Allergy

## 2023-09-14 ENCOUNTER — Encounter: Payer: Self-pay | Admitting: Allergy

## 2023-09-14 ENCOUNTER — Other Ambulatory Visit: Payer: Self-pay

## 2023-09-14 VITALS — BP 110/74 | HR 80 | Temp 98.7°F | Ht 63.0 in | Wt 153.9 lb

## 2023-09-14 DIAGNOSIS — T7840XD Allergy, unspecified, subsequent encounter: Secondary | ICD-10-CM | POA: Diagnosis not present

## 2023-09-14 DIAGNOSIS — T783XXD Angioneurotic edema, subsequent encounter: Secondary | ICD-10-CM

## 2023-09-14 MED ORDER — FAMOTIDINE 20 MG PO TABS: 20.0000 mg | ORAL_TABLET | Freq: Every day | ORAL | 5 refills | Status: AC

## 2023-09-14 MED ORDER — CETIRIZINE HCL 10 MG PO TABS: 10.0000 mg | ORAL_TABLET | Freq: Every day | ORAL | 5 refills | Status: AC

## 2023-09-14 NOTE — Progress Notes (Signed)
 New Patient Note  RE: Sherri Martinez MRN: [id number] DOB: [birth year] Date of Office Visit: 09/14/2023  Primary care provider: Ransom Other, MD  Chief Complaint: reactions  History of present illness: Sherri Martinez is a 48 y.o. female presenting today for evaluation of allergic reaction.   She was seen in UC on 09/06/23 for allergic reaction.  On exam she was noted to have rhinorrhea, posterior oropharyngeal erythema, watery discharge from the right and left eye, significant soft tissue swelling to the bilateral.  Picture obtained at the urgent care and put in the note that does show significant periorbital edema.  She was treated with p.o. Zyrtec  and Pepcid , IM Solu-Medrol .  Discharged to do a prednisone  burst as well as a prescription for EpiPen  and, Pataday , Zyrtec  and Pepcid .  Discussed the use of AI scribe software for clinical note transcription with the patient, who gave verbal consent to proceed.  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 Last year, she had a mild allergic reaction involving her eyes after exposure to a lavender diffuser, which resolved with Benadryl. Recently, she experienced a more severe reaction after visiting a school where multiple diffusers were in use. Symptoms began with itchy eyes and progressed to sneezing, eye swelling, and significant puffiness, leading her to seek urgent care. By the time she reached urgent care, her vision was impaired due to swelling.   Picture taken at [facility] from note 09/06/23:   The recent episode occurred within 45 minutes to an hour of exposure to the diffusers. No respiratory symptoms such as coughing, wheezing, or shortness of breath were noted, nor were there gastrointestinal symptoms like nausea, vomiting, or diarrhea. However, she did report feeling lightheaded and dizzy. She is unsure of the specific type of diffuser used during  the recent exposure. In addition to the diffuser exposure, she mentioned consuming a soy shake between school visits, which she has been using regularly for the past year without prior issues. She noted a slight constriction sensation after consuming the shake on a subsequent occasion but did not attribute it to the recent allergic episode. She has a history of consuming foods like mammalian meat but is not aware of any previous tick bites. She also mentioned exposure to a potentially moldy environment in a school trailer, which could have contributed to her symptoms. She has been provided with an EpiPen  device at this time.  She is no longer taking Zyrtec  or Pepcid .  She reports that her symptoms improved after taking the medications, but she still feels some swelling under her eyelids.  She saw her eye doctor recently and states that they noted there is still some edema under the eyelid which she can feel.  She feels about 90% improved from this reaction.     Review of systems: 10pt ROS negative unless noted in HPI   Past medical history: Past Medical History:  Diagnosis Date   Grave's disease    History of blood clots 2007   left arm    Past surgical history: Past Surgical History:  Procedure Laterality Date   CESAREAN SECTION  2009, 2010   x 2   diastasis repair  2011   LIPOMA EXCISION  06/02/2022    Family history:  Family History  Problem Relation Age of Onset   Hypertension Mother    Thyroid  disease Mother    Eczema Father    Hypertension Father  Colon cancer Neg Hx    Colon polyps Neg Hx    Esophageal cancer Neg Hx    Rectal cancer Neg Hx    Stomach cancer Neg Hx     Social history: Lives in a home with carpeting in the bedroom with electric heating with central and fan cooling.  Dog in the home.  There is no concern for water damage, mildew or roaches in the home.  She denies a smoking history.  Medication List: Current Outpatient Medications  Medication Sig  Dispense Refill   albuterol (VENTOLIN HFA) 108 (90 Base) MCG/ACT inhaler Inhale 2 puffs into the lungs every 6 (six) hours as needed.     aspirin 81 MG tablet Take 81 mg by mouth 3 (three) times a week. 3 time weekly then every Saturday or Sunday     busPIRone (BUSPAR) 5 MG tablet Take 5 mg by mouth 2 (two) times daily.     EPINEPHrine  0.3 mg/0.3 mL IJ SOAJ injection Inject 0.3 mg into the muscle as needed for anaphylaxis. 1 each 0   ibuprofen (ADVIL) 800 MG tablet Take 800 mg by mouth every 8 (eight) hours as needed.     levothyroxine  (SYNTHROID ) 100 MCG tablet Take 1 tablet (100 mcg total) by mouth in the morning on an empty stomach for 6 day/week 26 tablet 5   Levothyroxine  Sodium 88 MCG CAPS Take 88 mcg by mouth once a week. Every Sunday     Multiple Vitamins-Minerals (MULTI ADULT GUMMIES PO)      norethindrone (MICRONOR) 0.35 MG tablet Take 1 tablet by mouth daily.     olopatadine  (PATADAY ) 0.1 % ophthalmic solution Place 1 drop into both eyes 2 (two) times daily. 5 mL 12   triamcinolone cream (KENALOG) 0.1 % Apply 1 Application topically as needed.     UNABLE TO FIND as needed. Med Name: Fluocinolone Acetonide ear drops     Vitamin D, Ergocalciferol, (DRISDOL) 1.25 MG (50000 UNIT) CAPS capsule Take 50,000 Units by mouth every 30 (thirty) days.     cetirizine  (ZYRTEC ) 10 MG tablet Take 1 tablet (10 mg total) by mouth daily. 30 tablet 5   famotidine  (PEPCID ) 20 MG tablet Take 1 tablet (20 mg total) by mouth daily. 30 tablet 5   No current facility-administered medications for this visit.    Known medication allergies: Allergies  Allergen Reactions   Benzoyl Peroxide     Other Reaction(s): Other   Clarithromycin Nausea And Vomiting    Other Reaction(s): Dizziness   Latex Itching    Other Reaction(s): Unknown   Other     cyclens  Other Reaction(s): Unknown   Tetracycline     Other Reaction(s): Other (See Comments)  dizziness   Tetracyclines & Related Other (See Comments)     dizziness   Doxycycline Rash and Nausea And Vomiting    Other Reaction(s): Dizziness     Physical examination: Blood pressure 110/74, pulse 80, temperature 98.7 F (37.1 C), temperature source Temporal, height 5' 3 (1.6 m), weight 153 lb 14.4 oz (69.8 kg), SpO2 99%.  General: Alert, interactive, in no acute distress. HEENT: PERRLA, TMs pearly gray, turbinates non-edematous without discharge, post-pharynx non erythematous. Neck: Supple without lymphadenopathy. Lungs: Clear to auscultation without wheezing, rhonchi or rales. {no increased work of breathing. CV: Normal S1, S2 without murmurs. Abdomen: Nondistended, nontender. Skin: Warm and dry, without lesions or rashes. Extremities:  No clubbing, cyanosis or edema. Neuro:   Grossly intact.  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 Immediate IgE allergic reaction suspected. Effective response to antihistamines and steroids suggests histamine-driven process. Contact allergy testing considered for potential contact exposure with the diffusing oils. - Prescribe cetirizine  10mg  1 tab and famotidine  20mg  1 tab for daily use.  On days working in environment with diffusers then can increase both to twice a day use for more histamine blockade.  - Agree use of goggles and mask in environments with diffusing agents if not able to completely avoid.  - Provide emergency action plan for EpiPen  0.3mg  and antihistamines. - I will research contact allergens in patch testing related to diffusers to see if this would be a worthwhile test to pursue (especially if lab work is unremarkable).  Consider patch testing after four weeks post-steroid use if relevant allergens are identified. - Will obtain labs for tryptase level, environmental allergy panel, alpha gal, soy,  hereditary swelling panel - Consider Xolair for long-term management of reactions  Follow-up in 3-4 months or sooner if needed  I appreciate the opportunity to take part in [place] care. Please do not hesitate to contact me with questions.  Sincerely,   Danita Brain, MD Allergy/Immunology [facility]

## 2023-09-14 NOTE — Patient Instructions (Signed)
 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 Immediate IgE allergic reaction suspected. Effective response to antihistamines and steroids suggests histamine-driven process. Contact allergy testing considered for potential contact exposure with the diffusing oils. - Prescribe cetirizine  10mg  1 tab and famotidine  20mg  1 tab for daily use.  On days working in environment with diffusers then can increase both to twice a day use for more histamine blockade.  - Agree use of goggles and mask in environments with diffusing agents if not able to completely avoid.  - Provide emergency action plan for EpiPen  0.3mg  and antihistamines. - I will research contact allergens in patch testing related to diffusers to see if this would be a worthwhile test to pursue (especially if lab work is unremarkable).  Consider patch testing after four weeks post-steroid use if relevant allergens are identified. - Will obtain labs for tryptase level, environmental allergy panel, alpha gal, soy, hereditary swelling panel  Follow-up in 3-4 months or sooner if needed

## 2023-09-20 LAB — ALLERGEN PROFILE WITH TOTAL IGE, RESPIRATORY-AREA 2
Alternaria Alternata IgE: <0.1 kU/L
Aspergillus Fumigatus IgE: <0.1 kU/L
Bermuda Grass IgE: 1.09 kU/L — AB
Cat Dander IgE: 0.15 kU/L — AB
Cedar, Mountain IgE: 1.84 kU/L — AB
Cladosporium Herbarum IgE: <0.1 kU/L
Cockroach, German IgE: <0.1 kU/L
Common Silver Birch IgE: 11.3 kU/L — AB
Cottonwood IgE: <0.1 kU/L
D Farinae IgE: 0.57 kU/L — AB
D Pteronyssinus IgE: 0.42 kU/L — AB
Dog Dander IgE: 0.14 kU/L — AB
Elm, American IgE: 0.16 kU/L — AB
Johnson Grass IgE: 1.86 kU/L — AB
Maple/Box Elder IgE: 0.14 kU/L — AB
Mouse Urine IgE: <0.1 kU/L
Oak, White IgE: 2.49 kU/L — AB
Pecan, Hickory IgE: 1.16 kU/L — AB
Penicillium Chrysogen IgE: <0.1 kU/L
Pigweed, Rough IgE: <0.1 kU/L
Ragweed, Short IgE: 1.79 kU/L — AB
Sheep Sorrel IgE Qn: <0.1 kU/L
Timothy Grass IgE: 3.13 kU/L — AB
White Mulberry IgE: <0.1 kU/L

## 2023-09-20 LAB — COMPLEMENT COMPONENT C1Q: Complement C1Q: 11.6 mg/dL (ref 10.3–20.5)

## 2023-09-20 LAB — ALPHA-GAL PANEL
Allergen Lamb IgE: <0.1 kU/L
Beef IgE: <0.1 kU/L
IgE (Immunoglobulin E), Serum: 52 [IU]/mL (ref 6–495)
O215-IgE Alpha-Gal: <0.1 kU/L
Pork IgE: <0.1 kU/L

## 2023-09-20 LAB — ALLERGEN SOYBEAN: Soybean IgE: <0.1 kU/L

## 2023-09-20 LAB — TRYPTASE: Tryptase: 6.1 ug/L (ref 2.2–13.2)

## 2023-09-20 LAB — C1 ESTERASE INHIBITOR: C1INH SerPl-mCnc: 36 mg/dL (ref 21–39)

## 2023-09-20 LAB — C1 ESTERASE INHIBITOR, FUNCTIONAL: C1INH Functional/C1INH Total MFr SerPl: >105 %{normal}

## 2023-09-20 LAB — C4 COMPLEMENT: Complement C4, Serum: 43 mg/dL — ABNORMAL HIGH (ref 12–38)

## 2023-09-21 ENCOUNTER — Ambulatory Visit: Payer: Self-pay | Admitting: Allergy & Immunology

## 2023-10-04 ENCOUNTER — Other Ambulatory Visit (HOSPITAL_BASED_OUTPATIENT_CLINIC_OR_DEPARTMENT_OTHER): Payer: Self-pay

## 2023-10-04 ENCOUNTER — Ambulatory Visit: Payer: Self-pay | Admitting: Allergy

## 2023-10-04 MED ORDER — FLUZONE 0.5 ML IM SUSY
0.5000 mL | PREFILLED_SYRINGE | Freq: Once | INTRAMUSCULAR | 0 refills | Status: AC
  Filled 2023-10-04: qty 0.5, 1d supply, fill #0

## 2023-12-07 ENCOUNTER — Other Ambulatory Visit: Payer: Self-pay | Admitting: Obstetrics and Gynecology

## 2023-12-07 DIAGNOSIS — N6489 Other specified disorders of breast: Secondary | ICD-10-CM

## 2023-12-18 ENCOUNTER — Inpatient Hospital Stay
Admission: RE | Admit: 2023-12-18 | Discharge: 2023-12-18 | Attending: Obstetrics and Gynecology | Admitting: Obstetrics and Gynecology

## 2023-12-18 ENCOUNTER — Ambulatory Visit

## 2023-12-18 DIAGNOSIS — N6489 Other specified disorders of breast: Secondary | ICD-10-CM
